# Patient Record
Sex: Male | Born: 1950 | Race: White | Hispanic: No | Smoking: Never smoker
Health system: Southern US, Community
[De-identification: ages and names within clinical notes are randomized; demographics above are authoritative.]

## PROBLEM LIST (undated history)

## (undated) DIAGNOSIS — I519 Heart disease, unspecified: Secondary | ICD-10-CM

## (undated) DIAGNOSIS — I10 Essential (primary) hypertension: Secondary | ICD-10-CM

## (undated) DIAGNOSIS — E785 Hyperlipidemia, unspecified: Secondary | ICD-10-CM

## (undated) DIAGNOSIS — I2510 Atherosclerotic heart disease of native coronary artery without angina pectoris: Secondary | ICD-10-CM

## (undated) DIAGNOSIS — I480 Paroxysmal atrial fibrillation: Secondary | ICD-10-CM

## (undated) HISTORY — DX: Paroxysmal atrial fibrillation: I48.0

## (undated) HISTORY — DX: Heart disease, unspecified: I51.9

## (undated) HISTORY — DX: Essential (primary) hypertension: I10

## (undated) HISTORY — DX: Hyperlipidemia, unspecified: E78.5

## (undated) HISTORY — PX: COLONOSCOPY: SHX174

## (undated) HISTORY — DX: Atherosclerotic heart disease of native coronary artery without angina pectoris: I25.10

---

## 2003-11-22 ENCOUNTER — Ambulatory Visit (HOSPITAL_COMMUNITY): Admission: RE | Admit: 2003-11-22 | Discharge: 2003-11-22 | Payer: Self-pay | Admitting: General Surgery

## 2003-12-01 HISTORY — PX: NM MYOVIEW LTD: HXRAD82

## 2004-01-17 ENCOUNTER — Ambulatory Visit (HOSPITAL_COMMUNITY): Admission: RE | Admit: 2004-01-17 | Discharge: 2004-01-17 | Payer: Self-pay | Admitting: *Deleted

## 2004-01-21 ENCOUNTER — Ambulatory Visit (HOSPITAL_COMMUNITY): Admission: RE | Admit: 2004-01-21 | Discharge: 2004-01-21 | Payer: Self-pay | Admitting: *Deleted

## 2004-01-21 HISTORY — PX: CARDIAC CATHETERIZATION: SHX172

## 2004-03-09 ENCOUNTER — Emergency Department (HOSPITAL_COMMUNITY): Admission: EM | Admit: 2004-03-09 | Discharge: 2004-03-09 | Payer: Self-pay | Admitting: Emergency Medicine

## 2004-07-12 HISTORY — PX: DOPPLER ECHOCARDIOGRAPHY: SHX263

## 2004-09-20 ENCOUNTER — Inpatient Hospital Stay (HOSPITAL_COMMUNITY): Admission: EM | Admit: 2004-09-20 | Discharge: 2004-09-23 | Payer: Self-pay | Admitting: *Deleted

## 2005-11-01 ENCOUNTER — Ambulatory Visit (HOSPITAL_COMMUNITY): Admission: RE | Admit: 2005-11-01 | Discharge: 2005-11-01 | Payer: Self-pay | Admitting: *Deleted

## 2005-11-09 ENCOUNTER — Ambulatory Visit (HOSPITAL_COMMUNITY): Admission: RE | Admit: 2005-11-09 | Discharge: 2005-11-09 | Payer: Self-pay | Admitting: *Deleted

## 2005-11-09 HISTORY — PX: CARDIOVERSION: SHX1299

## 2010-02-02 ENCOUNTER — Ambulatory Visit (HOSPITAL_COMMUNITY): Admission: RE | Admit: 2010-02-02 | Discharge: 2010-02-02 | Payer: Self-pay | Admitting: Internal Medicine

## 2010-02-08 ENCOUNTER — Ambulatory Visit (HOSPITAL_COMMUNITY)
Admission: RE | Admit: 2010-02-08 | Discharge: 2010-02-08 | Payer: Self-pay | Source: Home / Self Care | Admitting: Urology

## 2010-07-21 NOTE — Cardiovascular Report (Signed)
[name], Dennis Snyder              ACCOUNT NO.:  [id number]   MEDICAL RECORD NO.:  [id number]          PATIENT TYPE:  OIB   LOCATION:  2899                         FACILITY:  [facility]   PHYSICIAN:  Darlin Priestly, MD  DATE OF BIRTH:  29-Aug-1950   DATE OF PROCEDURE:  01/21/2004  DATE OF DISCHARGE:                              CARDIAC CATHETERIZATION   PROCEDURES:  1.  Left heart catheterization.  2.  Coronary angiography.  3.  Left ventriculogram.  4.  Abdominal aortogram.   ATTENDING PHYSICIAN:  Darlin Priestly, M.D.   COMPLICATIONS:  None.   INDICATIONS:  [name] is a 60 year old patient of Dr. Artis Delay with a  history of rheumatic fever at age 68.  He does have a history of mild  hyperlipidemia with recent screening Cardiolite on November 23, 2003  revealing evidence of inferior lateral wall scar toward the apex with mild  periinfarct ischemia.  He is now referred for cardiac catheterization to  rule out significant coronary artery disease.   DESCRIPTION OF OPERATION:  After obtaining informed written consent, the  patient was brought to the cardiac catheterization laboratory.  Right and  left groin were shaved and prepped and draped in the usual sterile fashion.  ECG monitor was established.  Using the modified Seldinger technique, a 6  French arterial sheath was inserted in the right femoral artery. A 6 French  diagnostic catheter was then used to performed diagnostic angiography.   The left main is a large vessel with no significant disease.   The LAD is a large vessel that coursed to the apex and gives rise to two  diagonal branches.  The LAD is noted to have some mild 40% mid vessel  bridging.   The first and second diagonals are medium size vessels with no significant  disease.   The left coronary artery also gives rise to a medium size ramus intermedius  which bifurcates distally with no significant disease.   Left circumflex is a medium size vessel  which courses in the AV groove and  gives rise to two  obtuse marginal branches.  There is no significant  disease in the AV groove circumflex.   The first and second OMs are medium size vessels with no significant  disease.   The right coronary artery is a medium size vessel which is dominant and  gives rise to both PDA as well as posterior lateral branch.  The RCA is  noted to have some mild 30% ostial and proximal narrowing, but no further  significant disease in the RCA, PDA or posterior lateral branch.   LEFT VENTRICULOGRAM:  Reveals moderately depressed EF of approximately 40%  with inferior and apical akinesis.   ABDOMINAL AORTOGRAM:  Reveals no evidence of significant renal artery  stenosis.   HEMODYNAMICS:  Systemic arterial pressure 175/91, LV pressure 175/14, LVEDP  of 20.   CONCLUSIONS:  1.  No significant coronary artery disease.  2.  Moderately depressed left ventricular systolic function with wall motion      abnormalities as noted above.  3.  No evidence of renal artery  stenosis.  4.  Systemic hypertension.  5.  Elevated left ventricular end-diastolic pressure.      Robe   RHM/MEDQ  D:  01/21/2004  T:  01/21/2004  Job:  [id number]   cc:   Madelin Rear. Sherwood Gambler, MD  [address]  [place]  [place] [address]  Fax: [phone]

## 2010-07-21 NOTE — Op Note (Signed)
Dennis Snyder, Dennis Snyder              ACCOUNT NO.:  [id number]   MEDICAL RECORD NO.:  [id number]          PATIENT TYPE:  OIB   LOCATION:  2899                         FACILITY:  [facility]   PHYSICIAN:  Cristy Hilts. Jacinto Halim, MD       DATE OF BIRTH:  1950-07-21   DATE OF PROCEDURE:  11/09/2005  DATE OF DISCHARGE:  11/09/2005                                 OPERATIVE REPORT   CARDIOLOGIST:  Vonna Kotyk R. Jacinto Halim, MD.   PROCEDURE PERFORMED:  Direct-current cardioversion.   INDICATIONS:  Atrial fibrillation.   FINDINGS:  Unsuccessful attempt at cardioversion in spite of delivering  synchronized biphasic 100 J, and then 120 J x2.  The patient did convert to  sinus rhythm only transiently, but relapsed back into atrial fibrillation.   RECOMMENDATIONS:  Based on the fact that he is able to convert, but he is  unable to hold his sinus rhythm, would recommend we start him on amiodarone  at about 200 mg b.i.d. for 10 days, then 200 mg once a day, and we can  reattempt cardioversion in about 2 to 3 weeks.  This can be done on an  outpatient basis.  The patient will follow up with Dr. Lenise Herald.   TECHNIQUE:  Using the help of an anesthesiologist, and using deep sedation,  initially 100 J synchronized direct-current cardioversion was performed with  a biphasic defibrillator, initially using 100 J, and then 120 J x2.  The  procedure was abandoned because of inability to maintain sinus rhythm.      Cristy Hilts. Jacinto Halim, MD  Electronically Signed     JRG/MEDQ  D:  11/09/2005  T:  11/09/2005  Job:  [id number]   cc:   Darlin Priestly, MD  Kirk Ruths, M.D.

## 2010-07-21 NOTE — Discharge Summary (Signed)
[name], Dennis Snyder              ACCOUNT NO.:  [id number]   MEDICAL RECORD NO.:  [id number]          PATIENT TYPE:  INP   LOCATION:  3705                         FACILITY:  [facility]   PHYSICIAN:  Darlin Priestly, MD  DATE OF BIRTH:  1951-02-23   DATE OF ADMISSION:  09/20/2004  DATE OF DISCHARGE:  09/23/2004                                 DISCHARGE SUMMARY   DISCHARGE DIAGNOSES:  1.  Paroxysm atrial fibrillation, sinus rhythm at discharge.  2.  History of noncritical coronary disease.  3.  Left ventricular dysfunction with an ejection fraction of 40%.  4.  History of hypertension.  5.  History of hyperlipidemia.   HOSPITAL COURSE:  The patient is a 60 year old male followed by our group  who was found to be in atrial flutter on admission. He has had previous  Cardiolite study in September 2005 was low risk. Echocardiogram showed some  mitral valve thickening and mild TR and mild to moderate AR with good LV  function. He was admitted by Dr. Jenne Campus for new onset atrial fibrillation.  He was started on IV heparin and put on Coumadin. He converted spontaneously  to sinus rhythm, sinus bradycardia. Ultimately, we felt he could be  discharged September 23, 2004.   DISCHARGE MEDICATIONS:  1.  Coumadin 5 milligrams a day or as directed.  2.  Zocor 20 milligrams a day.  3.  Cardizem 180 milligrams a day.  4.  Baby aspirin q.d.   LABORATORY DATA:  White count 8.6, hemoglobin 13.1, hematocrit 37.9,  platelets 186,000. INR at discharge 3.3. Sodium 141, potassium 3.6, BUN 20,  creatinine 1.0. Liver functions were normal. CK is negative x1. Hemoglobin  A1c is 5.9. LDL 55, HDL 31, TSH 2.03.   DISPOSITION:  The patient is discharged in stable condition. He will have a  pro time Tuesday after discharge. He will follow up Dr. Jenne Campus in the  office.      Abelino Derrick, P.A.      Darlin Priestly, MD  Electronically Signed    LKK/MEDQ  D:  11/13/2004  T:  11/13/2004  Job:  [id number]   cc:   Darlin Priestly, MD  [phone] [address], [address]  [place]  [place] [address]  Fax: [phone]

## 2011-04-24 ENCOUNTER — Ambulatory Visit (HOSPITAL_COMMUNITY)
Admission: RE | Admit: 2011-04-24 | Discharge: 2011-04-24 | Disposition: A | Discharge status: Home / Self Care | Payer: Managed Care, Other (non HMO) | Source: Ambulatory Visit | Attending: Cardiovascular Disease | Admitting: Cardiovascular Disease

## 2011-04-24 DIAGNOSIS — I2510 Atherosclerotic heart disease of native coronary artery without angina pectoris: Secondary | ICD-10-CM | POA: Insufficient documentation

## 2011-04-24 DIAGNOSIS — I4891 Unspecified atrial fibrillation: Secondary | ICD-10-CM | POA: Insufficient documentation

## 2011-04-24 NOTE — Progress Notes (Signed)
*  PRELIMINARY RESULTS* Echocardiogram 2D Echocardiogram has been performed.  Conrad Burke 04/24/2011, 8:26 AM

## 2011-11-12 ENCOUNTER — Telehealth: Payer: Self-pay | Admitting: *Deleted

## 2011-11-12 NOTE — Telephone Encounter (Signed)
Dennis Snyder called today to speak with you about his colonoscopy appt. Please call him back. Thanks.

## 2011-11-12 NOTE — Telephone Encounter (Signed)
LMOM for a return call.  

## 2011-11-13 NOTE — Telephone Encounter (Signed)
LMOM at home for pt to call.  

## 2011-11-14 NOTE — Telephone Encounter (Signed)
Pt is scheduled for 11/20/2011 @ 8:00 Am with Tana Coast, PA ( on coumadin and needs to schedule screening colonoscopy.)

## 2011-11-20 ENCOUNTER — Encounter: Payer: Self-pay | Admitting: Gastroenterology

## 2011-11-20 ENCOUNTER — Telehealth: Payer: Self-pay

## 2011-11-20 ENCOUNTER — Ambulatory Visit (INDEPENDENT_AMBULATORY_CARE_PROVIDER_SITE_OTHER): Payer: Managed Care, Other (non HMO) | Admitting: Gastroenterology

## 2011-11-20 VITALS — BP 124/66 | HR 45 | Temp 97.4°F | Ht 71.0 in | Wt 200.0 lb

## 2011-11-20 DIAGNOSIS — Z1211 Encounter for screening for malignant neoplasm of colon: Secondary | ICD-10-CM

## 2011-11-20 DIAGNOSIS — Z79899 Other long term (current) drug therapy: Secondary | ICD-10-CM | POA: Insufficient documentation

## 2011-11-20 DIAGNOSIS — R197 Diarrhea, unspecified: Secondary | ICD-10-CM

## 2011-11-20 MED ORDER — PEG-KCL-NACL-NASULF-NA ASC-C 100 G PO SOLR: 1.0000 | ORAL | Status: DC

## 2011-11-20 NOTE — Telephone Encounter (Signed)
Called Harriett Sine at [facility]. She said that Dr. Allyson Sabal will be in the office on Friday this week. ( He is hospital based for the next couple of days). I faxed request to her to have Dr. Allyson Sabal address holding the pt's Pradaxa prior to colonoscopy. ( pt is scheduled for 12/17/2011). Confirmed with Nya that the fax was received and they will return when form is completed.

## 2011-11-20 NOTE — Assessment & Plan Note (Signed)
Patient presents to schedule colonoscopy. He is on Pradaxa for A. Fib. He had recent episode of diarrhea which lasted for several days but has since resolved. He tells me that every few months he will have a couple of days of loose stools but it takes care of itself. Otherwise, no alarm symptoms. Last colonoscopy over 10 years ago, he denies polyps.  I have discussed the risks, alternatives, benefits with regards to but not limited to the risk of reaction to medication, bleeding, infection, perforation and the patient is agreeable to proceed. Written consent to be obtained.

## 2011-11-20 NOTE — Progress Notes (Signed)
Primary Care Physician:  FUSCO,LAWRENCE J., MD  Primary Gastroenterologist:  Michael Rourk, MD   Chief Complaint  Patient presents with  . Colonoscopy    HPI:  Dennis Snyder is a 61 y.o. male here to schedule colonoscopy. Three to four weeks ago had severe diarrhea. Out of work for three days. Finally checked up. While having that, he had rotten belches. Rare heartburn. No dysphagia. No abdominal pain. Some excessive gas. Every few months has a couple days of diarrhea. No melena, brbpr. No weight loss. Last TCS over 10 years ago, no polyps per patient.   Current Outpatient Prescriptions  Medication Sig Dispense Refill  . amiodarone (PACERONE) 200 MG tablet Take 200 mg by mouth daily.      . Ascorbic Acid (VITAMIN C) 1000 MG tablet Take 1,000 mg by mouth daily.      . aspirin 81 MG tablet Take 81 mg by mouth daily.      . Coenzyme Q10 (CO Q-10) 100 MG CAPS Take 100 mg by mouth daily.      . fenofibrate 160 MG tablet Take 160 mg by mouth daily.       . fish oil-omega-3 fatty acids 1000 MG capsule Take 2 g by mouth daily.      . Glucosamine 500 MG CAPS Take 1,500 mg by mouth daily. With Chrondrotin 1200 mg      . lisinopril (PRINIVIL,ZESTRIL) 5 MG tablet Take 5 mg by mouth daily.       . Multiple Vitamin (MULTIVITAMIN) capsule Take 1 capsule by mouth daily.      . PRADAXA 150 MG CAPS 150 mg every 12 (twelve) hours.       . simvastatin (ZOCOR) 20 MG tablet Take 20 mg by mouth daily.       . Tamsulosin HCl (FLOMAX) 0.4 MG CAPS Take 0.4 mg by mouth daily.       . verapamil (VERELAN PM) 120 MG 24 hr capsule Take 120 mg by mouth at bedtime.       . peg 3350 powder (MOVIPREP) 100 G SOLR Take 1 kit (100 g total) by mouth as directed.  1 kit  0    Allergies as of 11/20/2011  . (Not on File)    Past Medical History  Diagnosis Date  . Hyperlipidemia   . Hypertension   . Ventricular dysfunction     Left/ ejection fraction of 40%.  . Coronary disease     History of noncritical   .  Paroxysmal atrial fibrillation     Past Surgical History  Procedure Date  . Colonoscopy     2002, no polyps per patient.     Family History  Problem Relation Age of Onset  . Colon cancer Neg Hx   . Liver disease Neg Hx     History   Social History  . Marital Status: Divorced    Spouse Name: N/A    Number of Children: N/A  . Years of Education: N/A   Occupational History  . [employer]    Social History Main Topics  . Smoking status: Never Smoker   . Smokeless tobacco: Not on file  . Alcohol Use: Yes     socially  . Drug Use: No  . Sexually Active: Not on file   Other Topics Concern  . Not on file   Social History Narrative  . No narrative on file      ROS:  General: Negative for anorexia, weight loss, fever, chills, fatigue,   weakness. Eyes: Negative for vision changes.  ENT: Negative for hoarseness, difficulty swallowing , nasal congestion. CV: Negative for chest pain, angina, palpitations, dyspnea on exertion, peripheral edema.  Respiratory: Negative for dyspnea at rest, dyspnea on exertion, cough, sputum, wheezing.  GI: See history of present illness. GU:  Negative for dysuria, hematuria, urinary incontinence, urinary frequency, nocturnal urination.  MS: Negative for joint pain, low back pain.  Derm: Negative for rash or itching.  Neuro: Negative for weakness, abnormal sensation, seizure, frequent headaches, memory loss, confusion.  Psych: Negative for anxiety, depression, suicidal ideation, hallucinations.  Endo: Negative for unusual weight change.  Heme: Negative for bruising or bleeding. Allergy: Negative for rash or hives.    Physical Examination:  BP 124/66  Pulse 45  Temp 97.4 F (36.3 C) (Temporal)  Ht 5' 11" (1.803 m)  Wt 200 lb (90.719 kg)  BMI 27.89 kg/m2   General: Well-nourished, well-developed in no acute distress.  Head: Normocephalic, atraumatic.   Eyes: Conjunctiva pink, no icterus. Mouth: Oropharyngeal mucosa moist and  pink , no lesions erythema or exudate. Neck: Supple without thyromegaly, masses, or lymphadenopathy.  Lungs: Clear to auscultation bilaterally.  Heart: Regular rate and rhythm, no murmurs rubs or gallops.  Abdomen: Bowel sounds are normal, nontender, nondistended, no hepatosplenomegaly or masses, no abdominal bruits or    hernia , no rebound or guarding.   Rectal: deferred Extremities: No lower extremity edema. No clubbing or deformities.  Neuro: Alert and oriented x 4 , grossly normal neurologically.  Skin: Warm and dry, no rash or jaundice.   Psych: Alert and cooperative, normal mood and affect.   

## 2011-11-20 NOTE — Patient Instructions (Addendum)
We have scheduled you for a colonoscopy with Dr. Jena Gauss. We will call you with further instructions regarding holding your Pradaxa prior to the procedure.

## 2011-11-23 ENCOUNTER — Other Ambulatory Visit: Payer: Self-pay | Admitting: Internal Medicine

## 2011-11-23 DIAGNOSIS — Z1211 Encounter for screening for malignant neoplasm of colon: Secondary | ICD-10-CM

## 2011-11-23 MED ORDER — PEG 3350-KCL-NA BICARB-NACL 420 G PO SOLR: 4000.0000 mL | ORAL | Status: DC

## 2011-11-26 NOTE — Telephone Encounter (Signed)
Received the fax from Dr. Allyson Sabal. Hold Pradaxa 2 days before procedure and two days after. Called and informed the pt.

## 2011-11-26 NOTE — Progress Notes (Signed)
Pt said he picked up two preps, movie prep and trilyte prep. I called pharmacy and spoke to Diane, and she said he did pick up two. ( Usually, we send a second prep if pharmacy or pt calls and says first too expensive or not covered). They gave him both. He said that he has not opened even the bag and would like to return. Per Diane, if he has not opened at all, he can get the refund. He said he is going to return the movie prep. I told him I am mailing his instructions again for the trilyte prep to assure he has correct instuctions. Placing in the mail today.

## 2011-12-03 NOTE — Progress Notes (Signed)
Patient given instructions to hold Pradaxa for two days before procedure. Per Dr. Allyson Sabal recommend holding for two days after procedure as well.

## 2011-12-03 NOTE — Telephone Encounter (Signed)
Noted. Will also document under original OV note.

## 2011-12-17 ENCOUNTER — Encounter (HOSPITAL_COMMUNITY): Payer: Self-pay | Admitting: *Deleted

## 2011-12-17 ENCOUNTER — Encounter (HOSPITAL_COMMUNITY)
Admission: RE | Disposition: A | Discharge status: Home / Self Care | Payer: Self-pay | Source: Ambulatory Visit | Attending: Internal Medicine

## 2011-12-17 ENCOUNTER — Ambulatory Visit (HOSPITAL_COMMUNITY)
Admission: RE | Admit: 2011-12-17 | Discharge: 2011-12-17 | Disposition: A | Discharge status: Home / Self Care | Payer: Managed Care, Other (non HMO) | Source: Ambulatory Visit | Attending: Internal Medicine | Admitting: Internal Medicine

## 2011-12-17 DIAGNOSIS — E785 Hyperlipidemia, unspecified: Secondary | ICD-10-CM | POA: Insufficient documentation

## 2011-12-17 DIAGNOSIS — Z1211 Encounter for screening for malignant neoplasm of colon: Secondary | ICD-10-CM

## 2011-12-17 DIAGNOSIS — I10 Essential (primary) hypertension: Secondary | ICD-10-CM | POA: Insufficient documentation

## 2011-12-17 HISTORY — PX: COLONOSCOPY: SHX5424

## 2011-12-17 HISTORY — DX: Atherosclerotic heart disease of native coronary artery without angina pectoris: I25.10

## 2011-12-17 SURGERY — COLONOSCOPY
Anesthesia: Moderate Sedation

## 2011-12-17 MED ORDER — MEPERIDINE HCL 100 MG/ML IJ SOLN
INTRAMUSCULAR | Status: DC | PRN
  Administered 2011-12-17: 50 mg via INTRAVENOUS

## 2011-12-17 MED ORDER — STERILE WATER FOR IRRIGATION IR SOLN
Status: DC | PRN
  Administered 2011-12-17: 10:00:00

## 2011-12-17 MED ORDER — MIDAZOLAM HCL 5 MG/5ML IJ SOLN
INTRAMUSCULAR | Status: AC
  Filled 2011-12-17: qty 10

## 2011-12-17 MED ORDER — MINERAL OIL PO OIL
TOPICAL_OIL | ORAL | Status: AC
  Filled 2011-12-17: qty 60

## 2011-12-17 MED ORDER — MEPERIDINE HCL 100 MG/ML IJ SOLN
INTRAMUSCULAR | Status: AC
  Filled 2011-12-17: qty 1

## 2011-12-17 MED ORDER — MIDAZOLAM HCL 5 MG/5ML IJ SOLN
INTRAMUSCULAR | Status: DC | PRN
  Administered 2011-12-17: 2 mg via INTRAVENOUS
  Administered 2011-12-17: 1 mg via INTRAVENOUS

## 2011-12-17 MED ORDER — SODIUM CHLORIDE 0.45 % IV SOLN
INTRAVENOUS | Status: DC
  Administered 2011-12-17: 10:00:00 via INTRAVENOUS

## 2011-12-17 NOTE — Interval H&P Note (Signed)
History and Physical Interval Note:  12/17/2011 9:59 AM  Dennis Snyder  has presented today for surgery, with the diagnosis of Screening Colonoscopy  The various methods of treatment have been discussed with the patient and family. After consideration of risks, benefits and other options for treatment, the patient has consented to  Procedure(s) (LRB) with comments: COLONOSCOPY (N/A) - 10:30 as a surgical intervention .  The patient's history has been reviewed, patient examined, no change in status, stable for surgery.  I have reviewed the patient's chart and labs.  Questions were answered to the patient's satisfaction.     Eula Listen

## 2011-12-17 NOTE — H&P (View-Only) (Signed)
Primary Care Physician:  Cassell Smiles., MD  Primary Gastroenterologist:  Roetta Sessions, MD   Chief Complaint  Patient presents with  . Colonoscopy    HPI:  Dennis Snyder is a 61 y.o. male here to schedule colonoscopy. Three to four weeks ago had severe diarrhea. Out of work for three days. Finally checked up. While having that, he had rotten belches. Rare heartburn. No dysphagia. No abdominal pain. Some excessive gas. Every few months has a couple days of diarrhea. No melena, brbpr. No weight loss. Last TCS over 10 years ago, no polyps per patient.   Current Outpatient Prescriptions  Medication Sig Dispense Refill  . amiodarone (PACERONE) 200 MG tablet Take 200 mg by mouth daily.      . Ascorbic Acid (VITAMIN C) 1000 MG tablet Take 1,000 mg by mouth daily.      Marland Kitchen aspirin 81 MG tablet Take 81 mg by mouth daily.      . Coenzyme Q10 (CO Q-10) 100 MG CAPS Take 100 mg by mouth daily.      . fenofibrate 160 MG tablet Take 160 mg by mouth daily.       . fish oil-omega-3 fatty acids 1000 MG capsule Take 2 g by mouth daily.      . Glucosamine 500 MG CAPS Take 1,500 mg by mouth daily. With Chrondrotin 1200 mg      . lisinopril (PRINIVIL,ZESTRIL) 5 MG tablet Take 5 mg by mouth daily.       . Multiple Vitamin (MULTIVITAMIN) capsule Take 1 capsule by mouth daily.      Marland Kitchen PRADAXA 150 MG CAPS 150 mg every 12 (twelve) hours.       . simvastatin (ZOCOR) 20 MG tablet Take 20 mg by mouth daily.       . Tamsulosin HCl (FLOMAX) 0.4 MG CAPS Take 0.4 mg by mouth daily.       . verapamil (VERELAN PM) 120 MG 24 hr capsule Take 120 mg by mouth at bedtime.       . peg 3350 powder (MOVIPREP) 100 G SOLR Take 1 kit (100 g total) by mouth as directed.  1 kit  0    Allergies as of 11/20/2011  . (Not on File)    Past Medical History  Diagnosis Date  . Hyperlipidemia   . Hypertension   . Ventricular dysfunction     Left/ ejection fraction of 40%.  . Coronary disease     History of noncritical   .  Paroxysmal atrial fibrillation     Past Surgical History  Procedure Date  . Colonoscopy     2002, no polyps per patient.     Family History  Problem Relation Age of Onset  . Colon cancer Neg Hx   . Liver disease Neg Hx     History   Social History  . Marital Status: Divorced    Spouse Name: N/A    Number of Children: N/A  . Years of Education: N/A   Occupational History  . [employer]    Social History Main Topics  . Smoking status: Never Smoker   . Smokeless tobacco: Not on file  . Alcohol Use: Yes     socially  . Drug Use: No  . Sexually Active: Not on file   Other Topics Concern  . Not on file   Social History Narrative  . No narrative on file      ROS:  General: Negative for anorexia, weight loss, fever, chills, fatigue,  weakness. Eyes: Negative for vision changes.  ENT: Negative for hoarseness, difficulty swallowing , nasal congestion. CV: Negative for chest pain, angina, palpitations, dyspnea on exertion, peripheral edema.  Respiratory: Negative for dyspnea at rest, dyspnea on exertion, cough, sputum, wheezing.  GI: See history of present illness. GU:  Negative for dysuria, hematuria, urinary incontinence, urinary frequency, nocturnal urination.  MS: Negative for joint pain, low back pain.  Derm: Negative for rash or itching.  Neuro: Negative for weakness, abnormal sensation, seizure, frequent headaches, memory loss, confusion.  Psych: Negative for anxiety, depression, suicidal ideation, hallucinations.  Endo: Negative for unusual weight change.  Heme: Negative for bruising or bleeding. Allergy: Negative for rash or hives.    Physical Examination:  BP 124/66  Pulse 45  Temp 97.4 F (36.3 C) (Temporal)  Ht 5\' 11"  (1.803 m)  Wt 200 lb (90.719 kg)  BMI 27.89 kg/m2   General: Well-nourished, well-developed in no acute distress.  Head: Normocephalic, atraumatic.   Eyes: Conjunctiva pink, no icterus. Mouth: Oropharyngeal mucosa moist and  pink , no lesions erythema or exudate. Neck: Supple without thyromegaly, masses, or lymphadenopathy.  Lungs: Clear to auscultation bilaterally.  Heart: Regular rate and rhythm, no murmurs rubs or gallops.  Abdomen: Bowel sounds are normal, nontender, nondistended, no hepatosplenomegaly or masses, no abdominal bruits or    hernia , no rebound or guarding.   Rectal: deferred Extremities: No lower extremity edema. No clubbing or deformities.  Neuro: Alert and oriented x 4 , grossly normal neurologically.  Skin: Warm and dry, no rash or jaundice.   Psych: Alert and cooperative, normal mood and affect.

## 2011-12-17 NOTE — Op Note (Signed)
[facility] [address] [place], [address]   COLONOSCOPY PROCEDURE REPORT  PATIENT: Dennis Snyder, Dennis Snyder  MR#:         [id number] BIRTHDATE: 1950-10-23 , 61  yrs. old GENDER: Male ENDOSCOPIST: R.  Roetta Sessions, MD FACP FACG REFERRED BY:  Artis Delay, M.D.  Runell Gess, M.D. PROCEDURE DATE:  12/17/2011 PROCEDURE:     Screening colonoscopy  INDICATIONS: Average risk colorectal cancer screening  INFORMED CONSENT:  The risks, benefits, alternatives and imponderables including but not limited to bleeding, perforation as well as the possibility of a missed lesion have been reviewed.  The potential for biopsy, lesion removal, etc. have also been discussed.  Questions have been answered.  All parties agreeable. Please see the history and physical in the medical record for more information.  MEDICATIONS: Versed 3 mg IV and Demerol 50 mg IV in divided doses.  DESCRIPTION OF PROCEDURE:  After a digital rectal exam was performed, the Pentax Colonoscope [id number]  colonoscope was advanced from the anus through the rectum and colon to the area of the cecum, ileocecal valve and appendiceal orifice.  The cecum was deeply intubated.  These structures were well-seen and photographed for the record.  From the level of the cecum and ileocecal valve, the scope was slowly and cautiously withdrawn.  The mucosal surfaces were carefully surveyed utilizing scope tip deflection to facilitate fold flattening as needed.  The scope was pulled down into the rectum where a thorough examination including retroflexion was performed.    FINDINGS:  Relatively poor prep compromised examination. Normal rectum. Grossly normal colonic mucosa. poor prep, particularly on the right side, may have obscured a subtle lesion.  THERAPEUTIC / DIAGNOSTIC MANEUVERS PERFORMED:  none  COMPLICATIONS: none; it is notable patient had a stable sinus bradycardia before, during and after the  procedure.  CECAL WITHDRAWAL TIME:  8 minutes  IMPRESSION:  Normal rectum and colon-see above  RECOMMENDATIONS: Repeat screening colonoscopy in 10 years.   _______________________________ eSigned:  R. Roetta Sessions, MD FACP [facility] 12/17/2011 10:49 AM   CC:

## 2011-12-21 ENCOUNTER — Encounter (HOSPITAL_COMMUNITY): Payer: Self-pay | Admitting: Internal Medicine

## 2012-07-01 ENCOUNTER — Other Ambulatory Visit (HOSPITAL_COMMUNITY): Payer: Self-pay | Admitting: Cardiovascular Disease

## 2012-07-01 DIAGNOSIS — I5030 Unspecified diastolic (congestive) heart failure: Secondary | ICD-10-CM

## 2012-07-02 ENCOUNTER — Ambulatory Visit (HOSPITAL_COMMUNITY)
Admission: RE | Admit: 2012-07-02 | Discharge: 2012-07-02 | Disposition: A | Discharge status: Home / Self Care | Payer: Managed Care, Other (non HMO) | Source: Ambulatory Visit | Attending: Cardiovascular Disease | Admitting: Cardiovascular Disease

## 2012-07-02 DIAGNOSIS — I5030 Unspecified diastolic (congestive) heart failure: Secondary | ICD-10-CM

## 2012-07-02 DIAGNOSIS — I501 Left ventricular failure: Secondary | ICD-10-CM | POA: Insufficient documentation

## 2012-07-02 HISTORY — PX: TRANSTHORACIC ECHOCARDIOGRAM: SHX275

## 2012-07-02 NOTE — Progress Notes (Signed)
[facility]   2D echo completed 07/02/2012.   Veda Canning, RDCS

## 2012-07-14 IMAGING — CR DG CHEST 2V
2 series · 2 of 2 positions shown · non-contrast
Comparison: 11/01/2005.

CLINICAL DATA: Coughing.  Upper chest pain.  Follow-up pneumonia on
the right side.

CHEST - 2 VIEW

[view not recorded (1 of 2)]
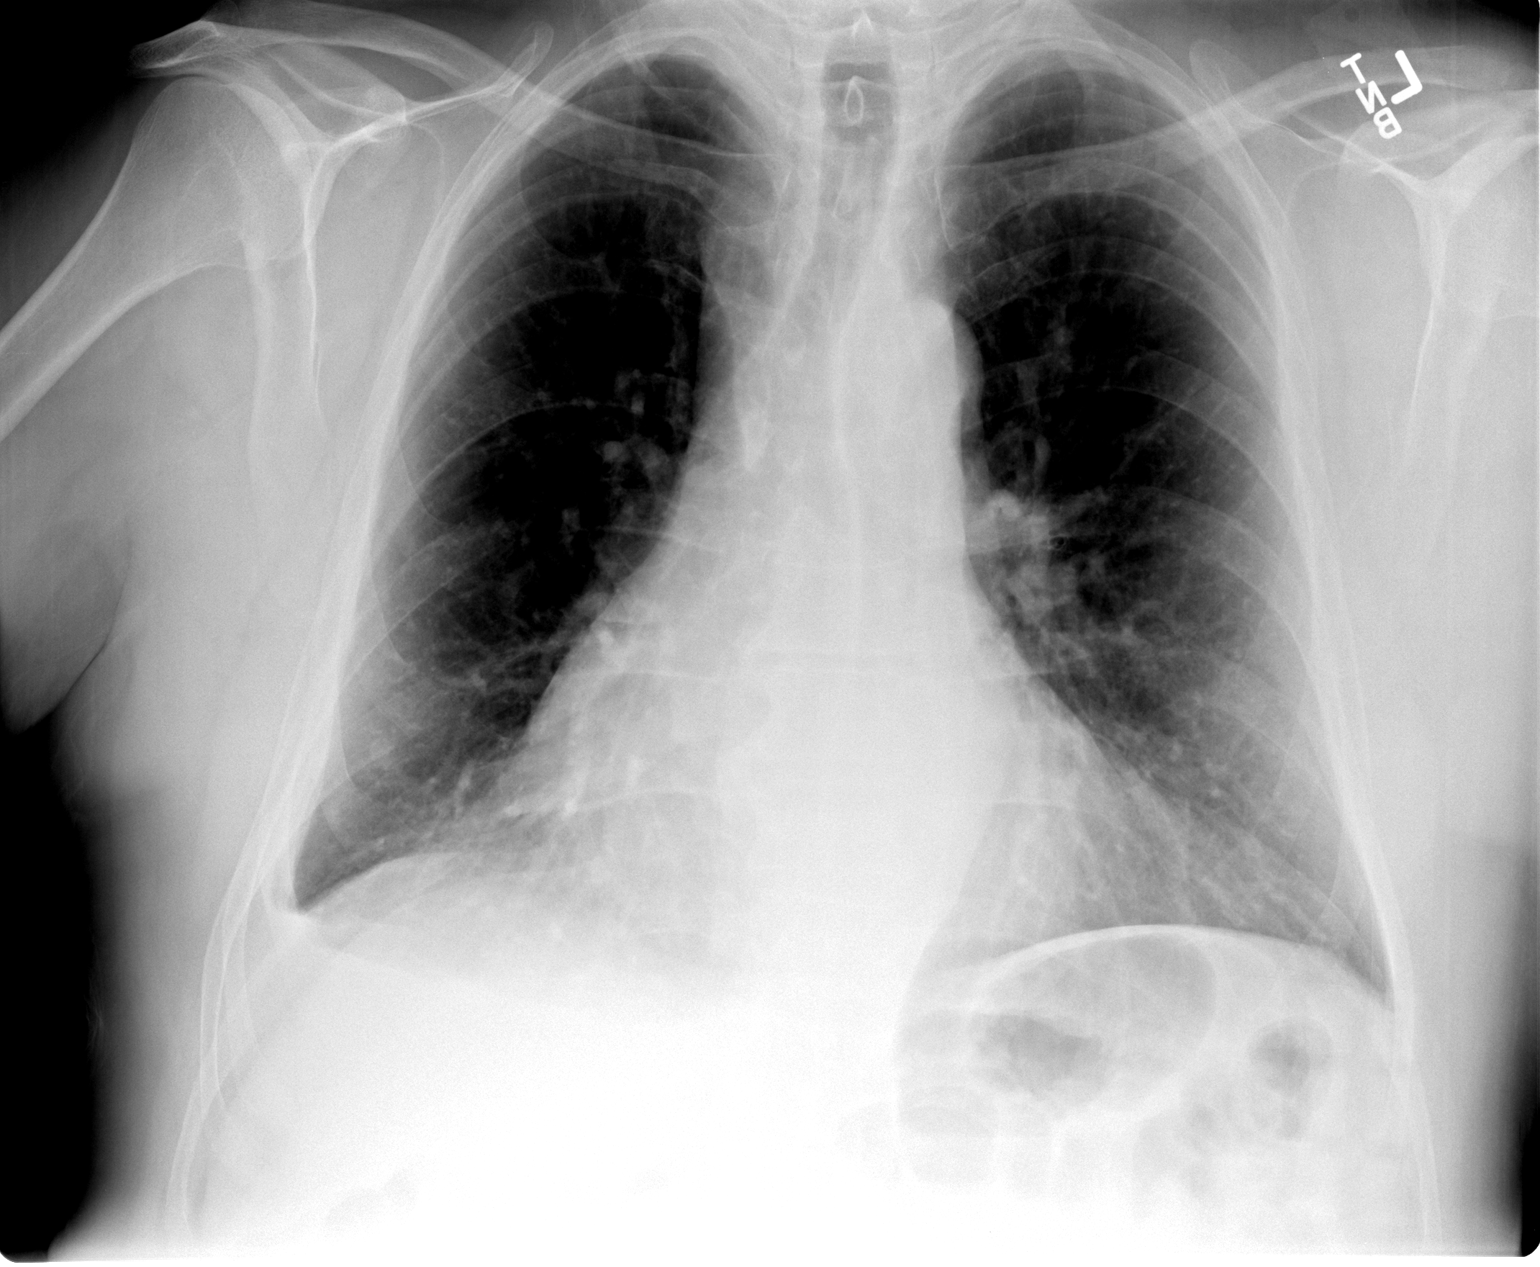

[view not recorded (2 of 2)]
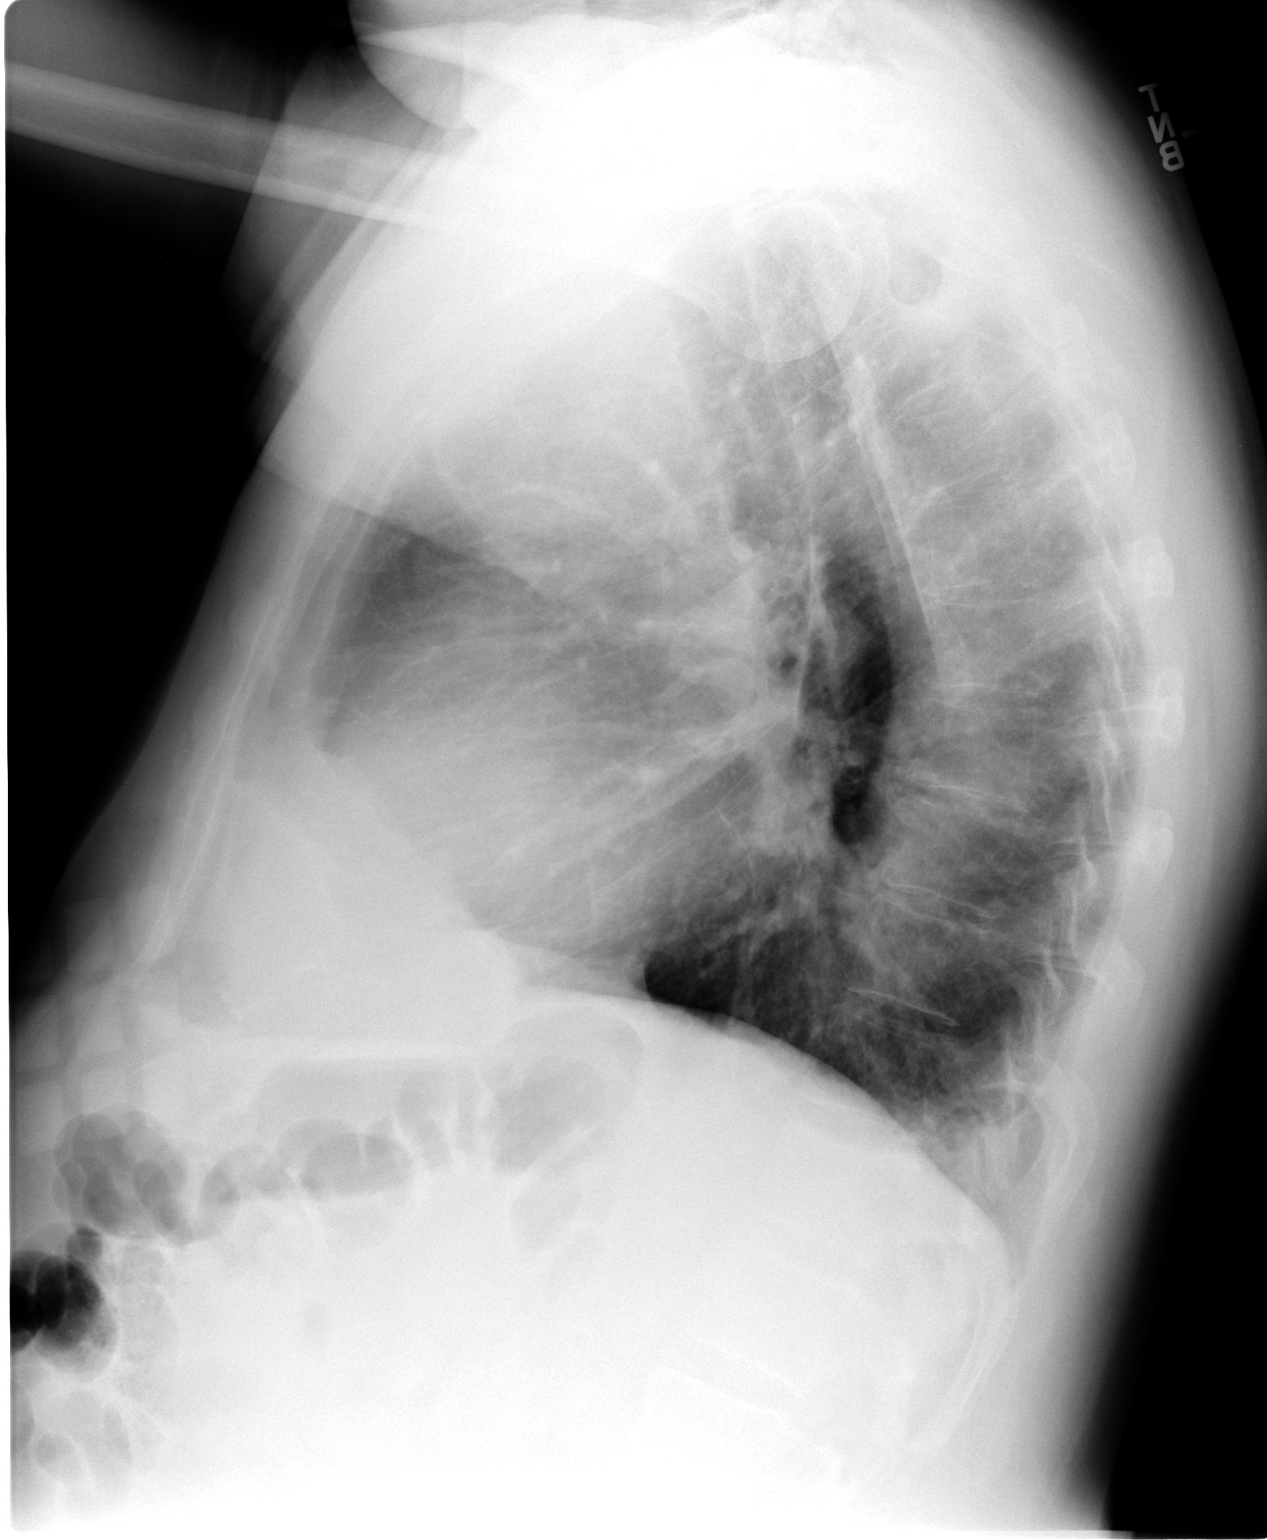

[2 of 2 positions shown; findings below may reference images not displayed]

FINDINGS: There is stable moderate enlargement of the cardiac
silhouette.  Ectasia and slight tortuosity of thoracic aorta is
identified.  There is noncalcific pleural thickening involving the
right costophrenic angle area and the lower lateral aspect of the
right hemithorax.  There is slight hyperinflation configuration.
No airspace disease is seen.  Bones appear average for age.  No
pneumothorax is evident.
IMPRESSION: Stable moderate enlargement of the cardiac silhouette.
There is noncalcific pleural thickening involving the right
costophrenic angle area and the lower lateral aspect of the right
hemithorax.  There is slight hyperinflation configuration.  No
airspace disease is seen.

## 2012-07-20 IMAGING — CT CT ABD-PEL WO/W CM
2 of 9 series · 11 of 46 positions shown, 18 images · IV contrast (Omnipaque 300)
Comparison: None.

CLINICAL DATA: Gross hematuria, left hydronephrosis

CT ABDOMEN AND PELVIS WITHOUT AND WITH CONTRAST
TECHNIQUE: Multidetector CT imaging of the abdomen and pelvis was
performed without contrast material in one or both body regions,
followed by contrast material(s) and further sections in one or
both body regions. Breast shield utilized.
Contrast: 125 ml Vmnipaque-9XX IV; oral contrast not administered
for this indication.

[Series 3: abd_pel_wo 5.0 b40f · axial · 0.76mm/px · z∈[-316,+70]mm · 9 of 97 slices shown, 15 images]
[im 10/97  soft-tissue]
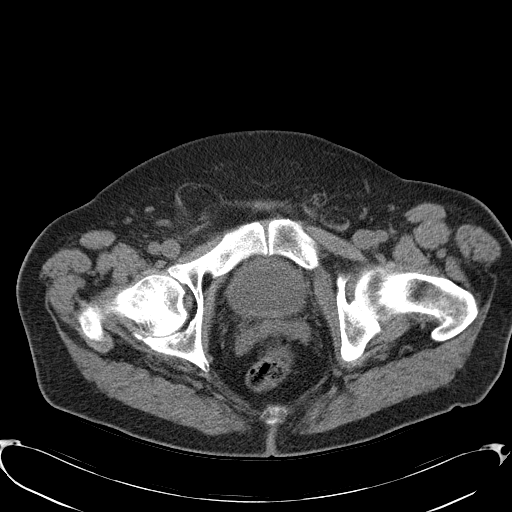
[im 10/97  bone]
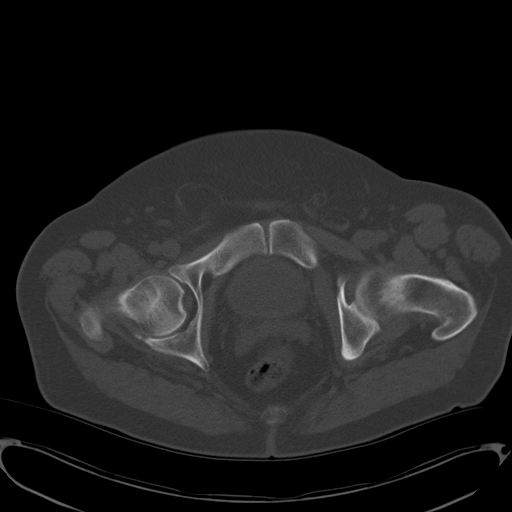
[im 20/97  soft-tissue]
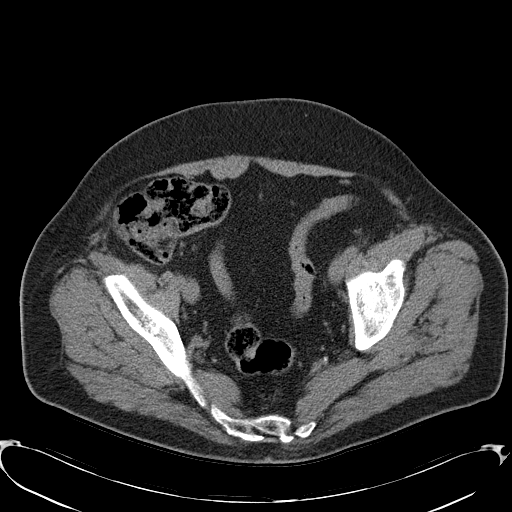
[im 29/97  soft-tissue]
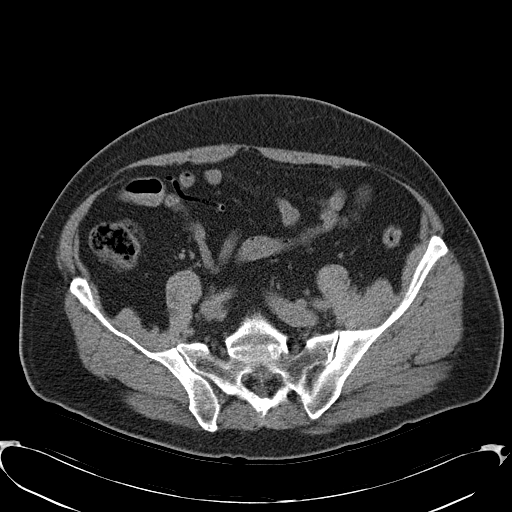
[im 39/97  soft-tissue]
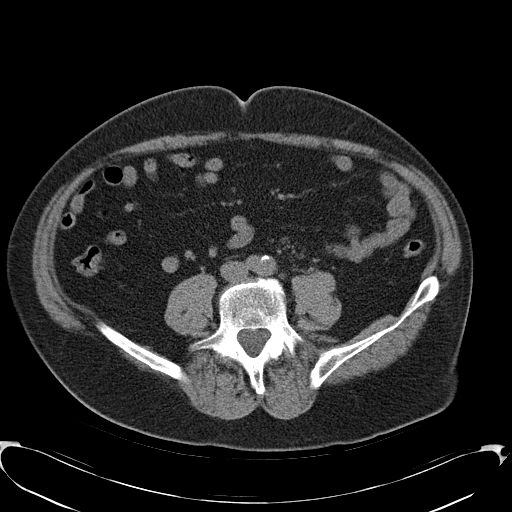
[im 49/97  soft-tissue]
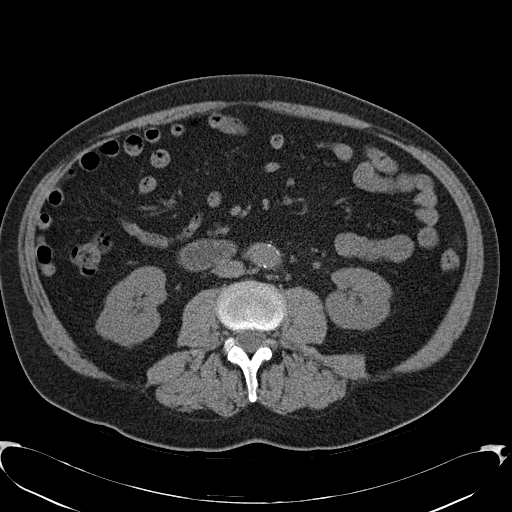
[im 58/97  soft-tissue]
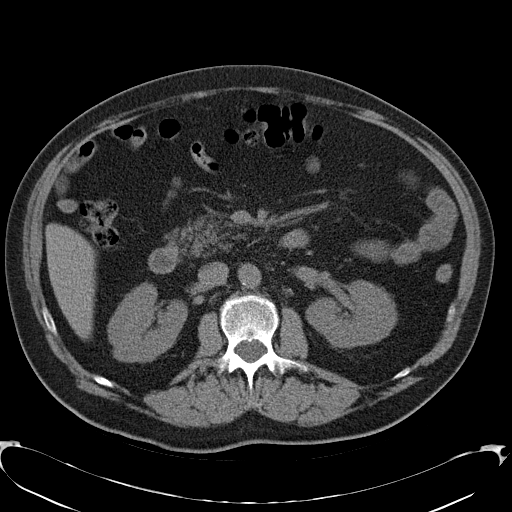
[im 58/97  lung]
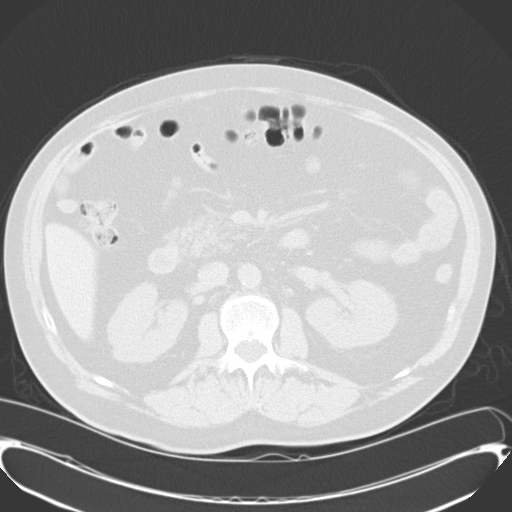
[im 68/97  soft-tissue]
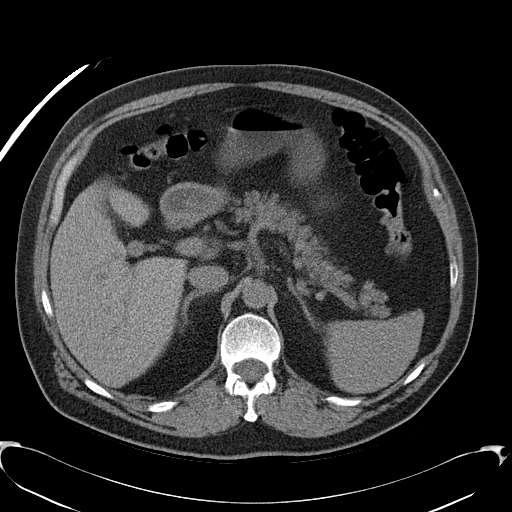
[im 68/97  lung]
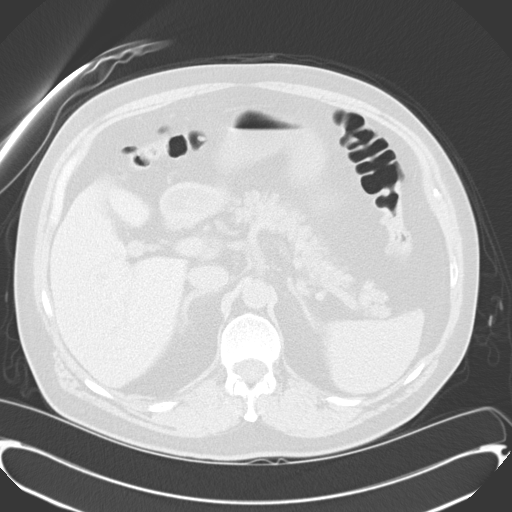
[im 77/97  soft-tissue]
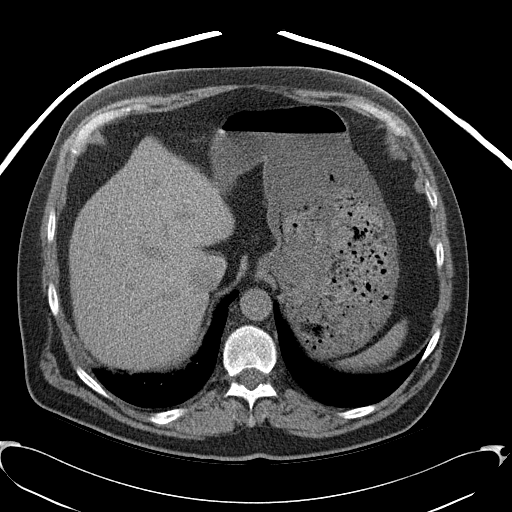
[im 77/97  lung]
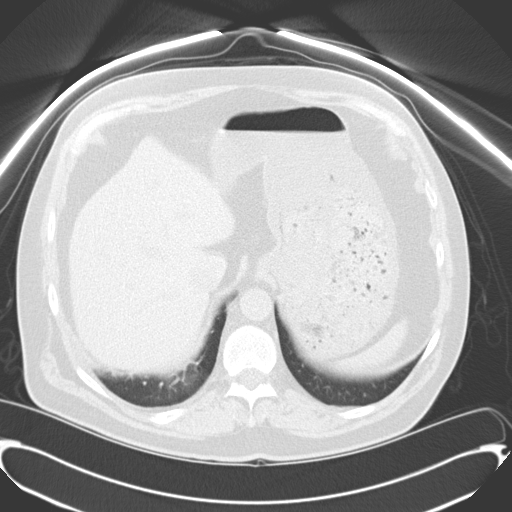
[im 87/97  soft-tissue]
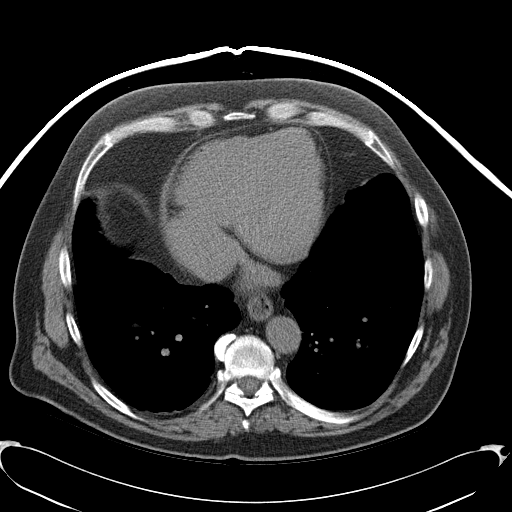
[im 87/97  lung]
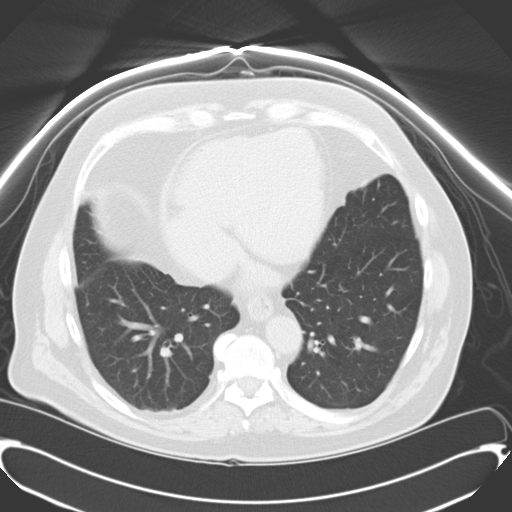
[im 87/97  bone]
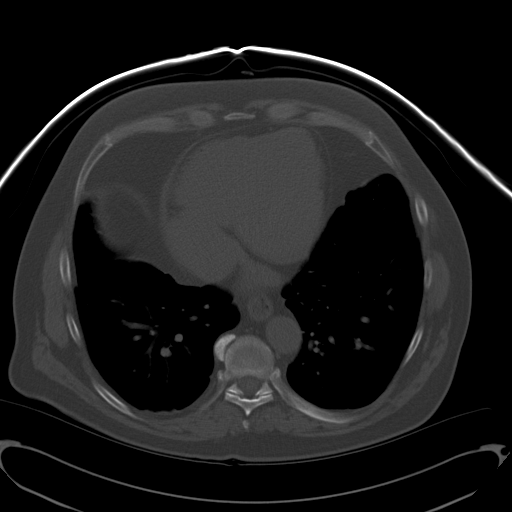

[Series 4: mpr pre contrast coronal 3.0 · coronal · non-contrast · 0.74mm/px · 2 of 99 slices shown, 3 images]
[im 33/99  soft-tissue]
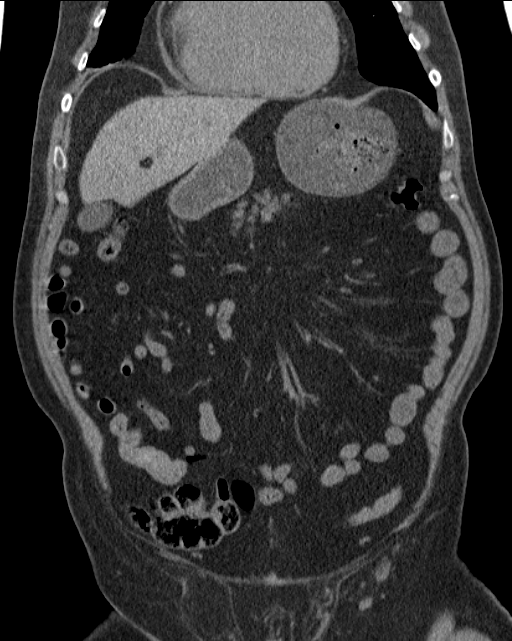
[im 33/99  bone]
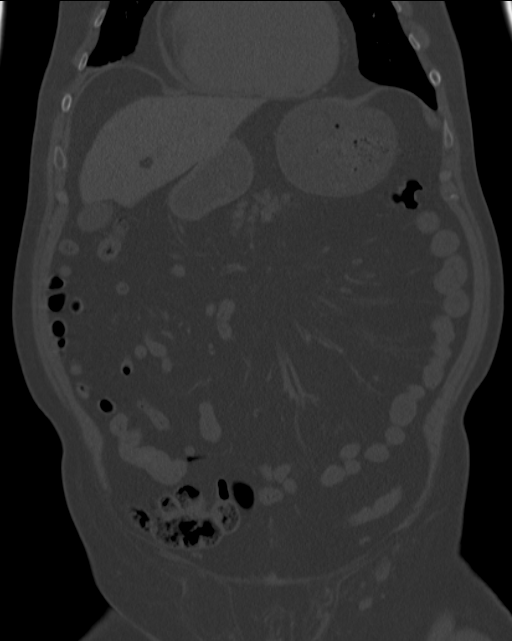
[im 66/99  soft-tissue]
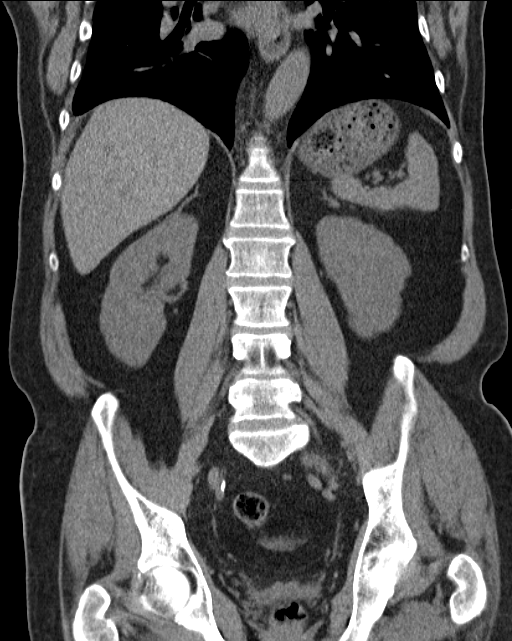

[11 of 46 positions shown; findings below may reference images not displayed]

FINDINGS: Minimal atelectasis right lung base.
No urinary tract calcification.
Single nonobstructed ureters.
Low attenuation foci in both kidneys, likely small cysts.
No definite solid renal mass or hydronephrosis.
Bladder only partially distended, otherwise unremarkable.
Enlarged right internal inguinal ring and right inguinal canal
compatible with inguinal hernia, containing fat.
Normal appendix.
Remainder of liver, spleen, pancreas, and adrenal glands normal
appearance.
Stomach and bowel loops unremarkable for technique.
No mass, adenopathy, free fluid or inflammatory process.
Few upper normal-sized inguinal lymph nodes are seen bilaterally.
Minimal enlargement of central lobar prostate gland.
No acute osseous findings.
IMPRESSION: No acute urinary tract abnormalities.
Probable tiny bilateral renal cysts.
Right inguinal hernia.

## 2013-03-15 ENCOUNTER — Other Ambulatory Visit: Payer: Self-pay | Admitting: Cardiovascular Disease

## 2013-03-16 ENCOUNTER — Other Ambulatory Visit: Payer: Self-pay | Admitting: *Deleted

## 2013-03-16 MED ORDER — LISINOPRIL 5 MG PO TABS: 5.0000 mg | ORAL_TABLET | Freq: Every day | ORAL | Status: DC

## 2013-03-16 NOTE — Telephone Encounter (Signed)
Rx was sent to pharmacy electronically. 

## 2013-03-17 NOTE — Telephone Encounter (Signed)
Rx(s) sent to pharmacy electronically.  

## 2013-03-18 ENCOUNTER — Other Ambulatory Visit: Payer: Self-pay | Admitting: *Deleted

## 2013-03-18 MED ORDER — LISINOPRIL 5 MG PO TABS: 5.0000 mg | ORAL_TABLET | Freq: Every day | ORAL | Status: DC

## 2013-03-30 ENCOUNTER — Telehealth: Payer: Self-pay | Admitting: Cardiovascular Disease

## 2013-03-30 MED ORDER — DABIGATRAN ETEXILATE MESYLATE 150 MG PO CAPS: 150.0000 mg | ORAL_CAPSULE | Freq: Two times a day (BID) | ORAL | Status: DC

## 2013-03-30 NOTE — Telephone Encounter (Signed)
Just received a letter from his mail order pharmacy saying they was still waiting to get an okay on his Pradaxa 150 mg prescription. Would you please call him and let him know what going on with his prescription.

## 2013-03-30 NOTE — Telephone Encounter (Signed)
Returned call and pt verified x 2.  Pt stated his mail order pharmacy said they haven't received a refill for Pradaxa.  Pt informed other meds received, but not for Pradaxa.  Informed RN will send now w/o refills as he must keep his appt in March.  Pt verbalized understanding and agreed w/ plan.  Refill(s) sent to pharmacy: [facility].

## 2013-05-05 ENCOUNTER — Encounter: Payer: Self-pay | Admitting: Cardiovascular Disease

## 2013-05-05 ENCOUNTER — Ambulatory Visit (INDEPENDENT_AMBULATORY_CARE_PROVIDER_SITE_OTHER): Payer: Managed Care, Other (non HMO) | Admitting: Cardiovascular Disease

## 2013-05-05 VITALS — BP 148/88 | HR 46 | Ht 70.0 in | Wt 206.0 lb

## 2013-05-05 DIAGNOSIS — I4891 Unspecified atrial fibrillation: Secondary | ICD-10-CM

## 2013-05-05 DIAGNOSIS — I10 Essential (primary) hypertension: Secondary | ICD-10-CM | POA: Insufficient documentation

## 2013-05-05 DIAGNOSIS — I2510 Atherosclerotic heart disease of native coronary artery without angina pectoris: Secondary | ICD-10-CM

## 2013-05-05 DIAGNOSIS — Z79899 Other long term (current) drug therapy: Secondary | ICD-10-CM

## 2013-05-05 DIAGNOSIS — E785 Hyperlipidemia, unspecified: Secondary | ICD-10-CM | POA: Insufficient documentation

## 2013-05-05 DIAGNOSIS — E782 Mixed hyperlipidemia: Secondary | ICD-10-CM

## 2013-05-05 DIAGNOSIS — I480 Paroxysmal atrial fibrillation: Secondary | ICD-10-CM

## 2013-05-05 MED ORDER — AMIODARONE HCL 200 MG PO TABS: 100.0000 mg | ORAL_TABLET | Freq: Every day | ORAL | Status: DC

## 2013-05-05 NOTE — Assessment & Plan Note (Signed)
On statin therapy. We will recheck a lipid and liver profile 

## 2013-05-05 NOTE — Assessment & Plan Note (Signed)
noncritical CAD by cath performed 01/21/04 by Dr. Lenise Heraldobert McQueen . He had 30% proximal RCA and 30% proximal LAD with an EF of 40% at that time. His most recent EF by 2-D echo performed 07/02/12 and was entirely normal. He denies chest pain or shortness of breath.

## 2013-05-05 NOTE — Assessment & Plan Note (Signed)
Maintaining sinus rhythm on amiodarone and Pradaxa . Am going to decrease his amiodarone from 200-100 mg a day.

## 2013-05-05 NOTE — Progress Notes (Signed)
05/05/2013 Dennis Snyder   03/07/1950  [id number]  Primary Physician Cassell SmilesFUSCO,LAWRENCE J., MD Primary Cardiologist: Runell GessJonathan J. Ceairra Mccarver MD Dennis RenoFACP,FACC,FAHA, FSCAI   HPI:  The patient is a 63 -year-old mildly-overweight divorced Caucasian male, father of 1 child, whom I last saw approximately 1 year ago. 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 His risk factors include hypertension and hyperlipidemia. He was catheterized by Dr. Lenise Heraldobert McQueen January 21, 2004, revealing noncritical CAD with moderate LV dysfunction. His EF was 40% with segmental wall motion abnormalities. He also had paroxysmal atrial fibrillation, on Pradaxa and amiodarone maintaining sinus rhythm. He is otherwise asymptomatic. His last lipid profile was performed over a year ago revealing a total cholesterol of 141, LDL 82 HDL 47. Since I saw him a year ago he's remained completely asymptomatic..      Current Outpatient Prescriptions  Medication Sig Dispense Refill  . amiodarone (PACERONE) 200 MG tablet Take 0.5 tablets (100 mg total) by mouth daily.  30 tablet  6  . Ascorbic Acid (VITAMIN C) 1000 MG tablet Take 1,000 mg by mouth daily.      Marland Kitchen. aspirin 81 MG tablet Take 81 mg by mouth daily.      . Coenzyme Q10 (CO Q-10) 100 MG CAPS Take 200 mg by mouth daily.       . dabigatran (PRADAXA) 150 MG CAPS capsule Take 1 capsule (150 mg total) by mouth every 12 (twelve) hours.  180 capsule  0  . fenofibrate 160 MG tablet TAKE 1 TABLET BY MOUTH DAILY AT BEDTIME  90 tablet  0  . fish oil-omega-3 fatty acids 1000 MG capsule Take 2 g by mouth daily.      . Glucosamine 500 MG CAPS Take 1,500 mg by mouth daily. With Chrondrotin 1200 mg      . lisinopril (PRINIVIL,ZESTRIL) 5 MG tablet Take 1 tablet (5 mg total) by mouth daily.  90 tablet  0  . MAGNESIUM CARBONATE PO Take 1 tablet by mouth daily.      . Multiple Vitamin (MULTIVITAMIN) capsule Take 1 capsule by mouth daily.      . simvastatin (ZOCOR) 20 MG tablet TAKE 1 TABLET BY MOUTH DAILY AT  BEDTIME  90 tablet  0  . Tamsulosin HCl (FLOMAX) 0.4 MG CAPS Take 0.4 mg by mouth daily.       . verapamil (VERELAN PM) 120 MG 24 hr capsule TAKE 1 CAPSULE BY MOUTH DAILY  90 capsule  0   No current facility-administered medications for this visit.    Allergies  Allergen Reactions  . Benadryl [Diphenhydramine Hcl] Other (See Comments)    Severe drowsiness that lasted 24 hours.    History   Social History  . Marital Status: Divorced    Spouse Name: N/A    Number of Children: N/A  . Years of Education: N/A   Occupational History  . [employer]    Social History Main Topics  . Smoking status: Never Smoker   . Smokeless tobacco: Not on file  . Alcohol Use: Yes     Comment: socially  . Drug Use: No  . Sexual Activity: Not on file   Other Topics Concern  . Not on file   Social History Narrative  . No narrative on file     Review of Systems: General: negative for chills, fever, night sweats or weight changes.  Cardiovascular: negative for chest pain, dyspnea on exertion, edema, orthopnea, palpitations, paroxysmal nocturnal dyspnea or shortness of breath Dermatological:  negative for rash Respiratory: negative for cough or wheezing Urologic: negative for hematuria Abdominal: negative for nausea, vomiting, diarrhea, bright red blood per rectum, melena, or hematemesis Neurologic: negative for visual changes, syncope, or dizziness All other systems reviewed and are otherwise negative except as noted above.    Blood pressure 148/88, pulse 46, height 5\' 10"  (1.778 m), weight 93.441 kg (206 lb).  General appearance: alert and no distress Neck: no adenopathy, no carotid bruit, no JVD, supple, symmetrical, trachea midline and thyroid not enlarged, symmetric, no tenderness/mass/nodules Lungs: clear to auscultation bilaterally Heart: regular rate and rhythm, S1, S2 normal, no murmur, click, rub or gallop Extremities: extremities normal, atraumatic, no cyanosis or edema  EKG  sinus bradycardia at 46 with inferolateral T-wave inversion.  ASSESSMENT AND PLAN:   Coronary artery disease noncritical CAD by cath performed 01/21/04 by Dr. Lenise Herald . He had 30% proximal RCA and 30% proximal LAD with an EF of 40% at that time. His most recent EF by 2-D echo performed 07/02/12 and was entirely normal. He denies chest pain or shortness of breath.  Essential hypertension Well-controlled on current medications  Hyperlipidemia On statin therapy. We will recheck a lipid and liver profile  Paroxysmal atrial fibrillation Maintaining sinus rhythm on amiodarone and Pradaxa . Am going to decrease his amiodarone from 200-100 mg a day.      Runell Gess MD FACP,FACC,FAHA, [facility] 05/05/2013 8:37 AM

## 2013-05-05 NOTE — Patient Instructions (Signed)
  We will see you back in follow up in 1 year with Dr Allyson SabalBerry.  Dr Allyson SabalBerry has ordered for you to have some blood work done, fasting.  This can be done at your convenience.   Dr Allyson SabalBerry wants you to decrease the Amiodarone to 100mg  daily.  Just cut your 200mg  tablet in half.

## 2013-05-05 NOTE — Assessment & Plan Note (Signed)
Well-controlled on current medications 

## 2013-06-07 ENCOUNTER — Other Ambulatory Visit: Payer: Self-pay | Admitting: Cardiovascular Disease

## 2013-06-08 NOTE — Telephone Encounter (Signed)
Prescriptions sent into pharmacy for Simvastatin, Lisinopril, Fenofibrate, and Verapamil. Pradaxa routed to Wolfgang PhoenixK Alvstad, PharmD for approval.

## 2013-06-16 LAB — HEPATIC FUNCTION PANEL
ALBUMIN: 4.2 g/dL (ref 3.5–5.2)
ALT: 22 U/L (ref 0–53)
AST: 20 U/L (ref 0–37)
Alkaline Phosphatase: 41 U/L (ref 39–117)
BILIRUBIN INDIRECT: 0.3 mg/dL (ref 0.2–1.2)
Bilirubin, Direct: 0.2 mg/dL (ref 0.0–0.3)
TOTAL PROTEIN: 6.9 g/dL (ref 6.0–8.3)
Total Bilirubin: 0.5 mg/dL (ref 0.2–1.2)

## 2013-06-16 LAB — LIPID PANEL
Cholesterol: 112 mg/dL (ref 0–200)
HDL: 40 mg/dL (ref >39)
LDL CALC: 59 mg/dL (ref 0–99)
TRIGLYCERIDES: 63 mg/dL (ref <150)
Total CHOL/HDL Ratio: 2.8 Ratio
VLDL: 13 mg/dL (ref 0–40)

## 2013-06-17 ENCOUNTER — Encounter: Payer: Self-pay | Admitting: *Deleted

## 2013-08-11 ENCOUNTER — Encounter (INDEPENDENT_AMBULATORY_CARE_PROVIDER_SITE_OTHER): Payer: Self-pay | Admitting: Surgery

## 2013-08-21 ENCOUNTER — Ambulatory Visit (INDEPENDENT_AMBULATORY_CARE_PROVIDER_SITE_OTHER): Payer: Managed Care, Other (non HMO) | Admitting: Surgery

## 2013-08-26 NOTE — H&P (Signed)
  NTS SOAP Note  Vital Signs:  Vitals as of: 08/25/2013: Systolic 126: Diastolic 78: Heart Rate 73: Temp 66F: Height 245ft 11in: Weight 207Lbs 0 Ounces: Pain Level 2: BMI 28.87  BMI : 28.87 kg/m2  Subjective: This [age] Years 464 Months old Male presents for of a right inguinal hernia.  Has been present for some time,  but is increasing in size and causing him discomfort.  Review of Symptoms:  Constitutional:unremarkable   Head:unremarkable    Eyes:unremarkable   sinus problems Cardiovascular:  unremarkable   Respiratory:  dyspnea Gastrointestinal:  unremarkable   Genitourinary:    frequency   joint, neck, and back pain Skin:unremarkable Hematolgic/Lymphatic:chronically anticoagulated     Allergic/Immunologic:unremarkable     Past Medical History:    Reviewed  Past Medical History  Surgical History: none Medical Problems: h/o atrial fib, HTN Allergies: benadryl Medications: verapaminl, simvastatin, tamsulosin, lisinopril, fneofibrate, amiodarone, pradaxa, baby asa, coQ   Social History:Reviewed  Social History  Preferred Language: English Race:  White Ethnicity: Not Hispanic / Latino Age: [age] Years 4 Months Marital Status:  S   Smoking Status: Never smoker reviewed on 08/25/2013 Functional Status reviewed on 08/25/2013 ------------------------------------------------ Bathing: Normal Cooking: Normal Dressing: Normal Driving: Normal Eating: Normal Managing Meds: Normal Oral Care: Normal Shopping: Normal Toileting: Normal Transferring: Normal Walking: Normal Cognitive Status reviewed on 08/25/2013 ------------------------------------------------ Attention: Normal Decision Making: Normal Language: Normal Memory: Normal Motor: Normal Perception: Normal Problem Solving: Normal Visual and Spatial: Normal   Family History:  Reviewed  Family Health History Mother, Deceased; Lung cancer;  Father, Deceased; Lung  cancer;     Objective Information: General:  Well appearing, well nourished in no distress. Neck:  Supple without lymphadenopathy.    Extra beats noted, no s3 s4 Lungs:    CTA bilaterally, no wheezes, rhonchi, rales.  Breathing unlabored. Abdomen:Soft, NT/ND, no HSM, no masses.  Reducible right inguinal hernia noted. YN:[id number]:unremarkable    Assessment:Right inguinal hernia  Diagnoses: 550.90 Inguinal hernia (Unilateral inguinal hernia, without obstruction or gangrene, not specified as recurrent)  Procedures: [id number] - OFFICE OUTPATIENT NEW 30 MINUTES    Plan:  Scheduled for right inguinal herniorrhaphy with mesh on 09/10/14.  Will stop pradaxa, asa three days prior to surgery.   Patient Education:Alternative treatments to surgery were discussed with patient (and family).  Risks and benefits  of procedure including bleeding, infection, mesh use, and the possibility of recurrence of the hernia were fully explained to the patient (and family) who gave informed consent. Patient/family questions were addressed.  Follow-up:Pending Surgery

## 2013-08-27 ENCOUNTER — Telehealth: Payer: Self-pay | Admitting: Cardiovascular Disease

## 2013-08-27 NOTE — Telephone Encounter (Signed)
Pt is going to have hernia surgery on 09-09-13. When should he stop his Pradaxa and for how long?

## 2013-08-27 NOTE — Telephone Encounter (Signed)
Dr Berry, please advise 

## 2013-08-27 NOTE — Telephone Encounter (Signed)
Inguinal surgical repair by Dr. [place] 09/09/13.  Anesthesia told patient to HOLD Pradaxa x 72 hours prior to surgery.  Patient calling to make sure this is OK.  Message sent to Dr. Allyson SabalBerry and Samara DeistKathryn.

## 2013-08-28 ENCOUNTER — Other Ambulatory Visit: Payer: Self-pay | Admitting: Cardiovascular Disease

## 2013-08-28 NOTE — Telephone Encounter (Signed)
This is okay.

## 2013-08-31 NOTE — Telephone Encounter (Signed)
Patient notified Dr. Allyson SabalBerry OK'd him to hold the Pradaxa 72 hours prior to surgery and restart ASAP.  Patient voiced understanding.

## 2013-09-03 ENCOUNTER — Encounter (HOSPITAL_COMMUNITY): Payer: Self-pay | Admitting: Pharmacy Technician

## 2013-09-03 ENCOUNTER — Encounter (HOSPITAL_COMMUNITY): Payer: Self-pay

## 2013-09-03 ENCOUNTER — Encounter (HOSPITAL_COMMUNITY)
Admission: RE | Admit: 2013-09-03 | Discharge: 2013-09-03 | Disposition: A | Discharge status: Home / Self Care | Payer: Managed Care, Other (non HMO) | Source: Ambulatory Visit | Attending: General Surgery | Admitting: General Surgery

## 2013-09-03 DIAGNOSIS — Z01812 Encounter for preprocedural laboratory examination: Secondary | ICD-10-CM | POA: Insufficient documentation

## 2013-09-03 LAB — CBC WITH DIFFERENTIAL/PLATELET
Basophils Absolute: 0.1 10*3/uL (ref 0.0–0.1)
Basophils Relative: 1 % (ref 0–1)
Eosinophils Absolute: 0.3 10*3/uL (ref 0.0–0.7)
Eosinophils Relative: 5 % (ref 0–5)
HCT: 41 % (ref 39.0–52.0)
HEMOGLOBIN: 13.9 g/dL (ref 13.0–17.0)
LYMPHS ABS: 1.7 10*3/uL (ref 0.7–4.0)
Lymphocytes Relative: 28 % (ref 12–46)
MCH: 32.9 pg (ref 26.0–34.0)
MCHC: 33.9 g/dL (ref 30.0–36.0)
MCV: 97.2 fL (ref 78.0–100.0)
MONOS PCT: 15 % — AB (ref 3–12)
Monocytes Absolute: 0.9 10*3/uL (ref 0.1–1.0)
NEUTROS ABS: 3.2 10*3/uL (ref 1.7–7.7)
NEUTROS PCT: 51 % (ref 43–77)
Platelets: 238 10*3/uL (ref 150–400)
RBC: 4.22 MIL/uL (ref 4.22–5.81)
RDW: 14.7 % (ref 11.5–15.5)
WBC: 6.2 10*3/uL (ref 4.0–10.5)

## 2013-09-03 LAB — BASIC METABOLIC PANEL
ANION GAP: 12 (ref 5–15)
BUN: 31 mg/dL — ABNORMAL HIGH (ref 6–23)
CHLORIDE: 108 meq/L (ref 96–112)
CO2: 25 mEq/L (ref 19–32)
Calcium: 9.6 mg/dL (ref 8.4–10.5)
Creatinine, Ser: 1.18 mg/dL (ref 0.50–1.35)
GFR calc non Af Amer: 64 mL/min — ABNORMAL LOW (ref >90)
GFR, EST AFRICAN AMERICAN: 74 mL/min — AB (ref >90)
Glucose, Bld: 111 mg/dL — ABNORMAL HIGH (ref 70–99)
POTASSIUM: 4.2 meq/L (ref 3.7–5.3)
SODIUM: 145 meq/L (ref 137–147)

## 2013-09-03 NOTE — Patient Instructions (Signed)
Benay Spicernest R Lyday  09/03/2013   Your procedure is scheduled on:  09/09/2013  [facility] at  700 AM.  Call this number if you have problems the morning of surgery: [phone]   Remember:   Do not eat food or drink liquids after midnight.   Take these medicines the morning of surgery with A SIP OF WATER:  Amiodorone, fenofibrate, lisinopril, verapamil   Do not wear jewelry, make-up or nail polish.  Do not wear lotions, powders, or perfumes.   Do not shave 48 hours prior to surgery. Men may shave face and neck.  Do not bring valuables to the hospital.  [facility] is not responsible  for any belongings or valuables.               Contacts, dentures or bridgework may not be worn into surgery.  Leave suitcase in the car. After surgery it may be brought to your room.  For patients admitted to the hospital, discharge time is determined by your treatment team.               Patients discharged the day of surgery will not be allowed to drive home.  Name and phone number of your driver: family  Special Instructions: Shower using CHG 2 nights before surgery and the night before surgery.  If you shower the day of surgery use CHG.  Use special wash - you have one bottle of CHG for all showers.  You should use approximately 1/3 of the bottle for each shower.   Please read over the following fact sheets that you were given: Pain Booklet, Coughing and Deep Breathing, Surgical Site Infection Prevention, Anesthesia Post-op Instructions and Care and Recovery After Surgery Hernia A hernia happens when an organ inside your body pushes out through a weak spot in your belly (abdominal) wall. Most hernias get worse over time. They can often be pushed back into place (reduced). Surgery may be needed to repair hernias that cannot be pushed into place. HOME CARE  Keep doing normal activities.  Avoid lifting more than 10 pounds (4.5 kilograms).  Cough gently and avoid straining. Over time, these  things will:  Increase your hernia size.  Irritate your hernia.  Break down hernia repairs.  Stop smoking.  Do not wear anything tight over your hernia. Do not keep the hernia in with an outside bandage.  Eat food that is high in fiber (fruit, vegetables, whole grains).  Drink enough fluids to keep your pee (urine) clear or pale yellow.  Take medicines to make your poop soft (stool softeners) if you cannot poop (constipated). GET HELP RIGHT AWAY IF:   You have a fever.  You have belly pain that gets worse.  You feel sick to your stomach (nauseous) and throw up (vomit).  Your skin starts to bulge out.  Your hernia turns a different color, feels hard, or is tender.  You have increased pain or puffiness (swelling) around the hernia.  You poop more or less often.  Your poop does not look the way normally does.  You have watery poop (diarrhea).  You cannot push the hernia back in place by applying gentle pressure while lying down. MAKE SURE YOU:   Understand these instructions.  Will watch your condition.  Will get help right away if you are not doing well or get worse. Document Released: 08/09/2009 Document Revised: 05/14/2011 Document Reviewed: 08/09/2009 [facility] Patient Information 2015 [place] [employer] [place]. This information is not  intended to replace advice given to you by your health care provider. Make sure you discuss any questions you have with your health care provider. PATIENT INSTRUCTIONS POST-ANESTHESIA  IMMEDIATELY FOLLOWING SURGERY:  Do not drive or operate machinery for the first twenty four hours after surgery.  Do not make any important decisions for twenty four hours after surgery or while taking narcotic pain medications or sedatives.  If you develop intractable nausea and vomiting or a severe headache please notify your doctor immediately.  FOLLOW-UP:  Please make an appointment with your surgeon as instructed. You do not need to follow up with  anesthesia unless specifically instructed to do so.  WOUND CARE INSTRUCTIONS (if applicable):  Keep a dry clean dressing on the anesthesia/puncture wound site if there is drainage.  Once the wound has quit draining you may leave it open to air.  Generally you should leave the bandage intact for twenty four hours unless there is drainage.  If the epidural site drains for more than 36-48 hours please call the anesthesia department.  QUESTIONS?:  Please feel free to call your physician or the hospital operator if you have any questions, and they will be happy to assist you.

## 2013-09-03 NOTE — Pre-Procedure Instructions (Signed)
Pt instructed by Dr Lovell SheehanJenkins to stop Pradaxa Sunday.  Dr Jayme CloudGonzalez aware.

## 2013-09-09 ENCOUNTER — Encounter (HOSPITAL_COMMUNITY): Payer: Self-pay | Admitting: *Deleted

## 2013-09-09 ENCOUNTER — Encounter (HOSPITAL_COMMUNITY): Payer: Managed Care, Other (non HMO) | Admitting: Anesthesiology

## 2013-09-09 ENCOUNTER — Ambulatory Visit (HOSPITAL_COMMUNITY): Payer: Managed Care, Other (non HMO) | Admitting: Anesthesiology

## 2013-09-09 ENCOUNTER — Ambulatory Visit (HOSPITAL_COMMUNITY)
Admission: RE | Admit: 2013-09-09 | Discharge: 2013-09-09 | Disposition: A | Discharge status: Home / Self Care | Payer: Managed Care, Other (non HMO) | Source: Ambulatory Visit | Attending: General Surgery | Admitting: General Surgery

## 2013-09-09 ENCOUNTER — Encounter (HOSPITAL_COMMUNITY)
Admission: RE | Disposition: A | Discharge status: Home / Self Care | Payer: Self-pay | Source: Ambulatory Visit | Attending: General Surgery

## 2013-09-09 DIAGNOSIS — I10 Essential (primary) hypertension: Secondary | ICD-10-CM | POA: Insufficient documentation

## 2013-09-09 DIAGNOSIS — I4891 Unspecified atrial fibrillation: Secondary | ICD-10-CM | POA: Insufficient documentation

## 2013-09-09 DIAGNOSIS — Z7982 Long term (current) use of aspirin: Secondary | ICD-10-CM | POA: Insufficient documentation

## 2013-09-09 DIAGNOSIS — K4090 Unilateral inguinal hernia, without obstruction or gangrene, not specified as recurrent: Secondary | ICD-10-CM | POA: Insufficient documentation

## 2013-09-09 DIAGNOSIS — Z79899 Other long term (current) drug therapy: Secondary | ICD-10-CM | POA: Insufficient documentation

## 2013-09-09 HISTORY — PX: INGUINAL HERNIA REPAIR: SHX194

## 2013-09-09 HISTORY — PX: INSERTION OF MESH: SHX5868

## 2013-09-09 SURGERY — REPAIR, HERNIA, INGUINAL, ADULT
Anesthesia: General | Site: Abdomen | Laterality: Right

## 2013-09-09 MED ORDER — FENTANYL CITRATE 0.05 MG/ML IJ SOLN
INTRAMUSCULAR | Status: DC | PRN
  Administered 2013-09-09 (×3): 25 ug via INTRAVENOUS

## 2013-09-09 MED ORDER — BUPIVACAINE LIPOSOME 1.3 % IJ SUSP
INTRAMUSCULAR | Status: DC | PRN
  Administered 2013-09-09: 20 mL

## 2013-09-09 MED ORDER — ONDANSETRON HCL 4 MG/2ML IJ SOLN
INTRAMUSCULAR | Status: AC
  Filled 2013-09-09: qty 2

## 2013-09-09 MED ORDER — HYDROCODONE-ACETAMINOPHEN 5-325 MG PO TABS: 1.0000 | ORAL_TABLET | ORAL | Status: AC | PRN

## 2013-09-09 MED ORDER — BUPIVACAINE LIPOSOME 1.3 % IJ SUSP
INTRAMUSCULAR | Status: AC
  Filled 2013-09-09: qty 20

## 2013-09-09 MED ORDER — PROPOFOL 10 MG/ML IV EMUL
INTRAVENOUS | Status: AC
  Filled 2013-09-09: qty 20

## 2013-09-09 MED ORDER — CEFAZOLIN SODIUM-DEXTROSE 2-3 GM-% IV SOLR
2.0000 g | INTRAVENOUS | Status: AC
  Administered 2013-09-09: 2 g via INTRAVENOUS
  Filled 2013-09-09: qty 50

## 2013-09-09 MED ORDER — MIDAZOLAM HCL 2 MG/2ML IJ SOLN
INTRAMUSCULAR | Status: AC
  Filled 2013-09-09: qty 2

## 2013-09-09 MED ORDER — KETOROLAC TROMETHAMINE 30 MG/ML IJ SOLN
30.0000 mg | Freq: Once | INTRAMUSCULAR | Status: AC
  Administered 2013-09-09: 30 mg via INTRAVENOUS
  Filled 2013-09-09: qty 1

## 2013-09-09 MED ORDER — FENTANYL CITRATE 0.05 MG/ML IJ SOLN
25.0000 ug | INTRAMUSCULAR | Status: DC | PRN
  Administered 2013-09-09 (×2): 25 ug via INTRAVENOUS

## 2013-09-09 MED ORDER — MIDAZOLAM HCL 2 MG/2ML IJ SOLN: 1.0000 mg | INTRAMUSCULAR | Status: DC | PRN

## 2013-09-09 MED ORDER — LACTATED RINGERS IV SOLN
INTRAVENOUS | Status: DC
  Administered 2013-09-09 (×2): via INTRAVENOUS

## 2013-09-09 MED ORDER — LACTATED RINGERS IV SOLN: INTRAVENOUS | Status: DC

## 2013-09-09 MED ORDER — SODIUM CHLORIDE 0.9 % IR SOLN
Status: DC | PRN
  Administered 2013-09-09: 1000 mL

## 2013-09-09 MED ORDER — PHENYLEPHRINE HCL 10 MG/ML IJ SOLN
INTRAMUSCULAR | Status: DC | PRN
  Administered 2013-09-09: 50 ug via INTRAVENOUS
  Administered 2013-09-09: 100 ug via INTRAVENOUS
  Administered 2013-09-09 (×2): 50 ug via INTRAVENOUS
  Administered 2013-09-09: 100 ug via INTRAVENOUS
  Administered 2013-09-09: 50 ug via INTRAVENOUS

## 2013-09-09 MED ORDER — GLYCOPYRROLATE 0.2 MG/ML IJ SOLN
INTRAMUSCULAR | Status: AC
  Filled 2013-09-09: qty 1

## 2013-09-09 MED ORDER — FENTANYL CITRATE 0.05 MG/ML IJ SOLN
25.0000 ug | INTRAMUSCULAR | Status: AC
  Administered 2013-09-09 (×2): 25 ug via INTRAVENOUS

## 2013-09-09 MED ORDER — FENTANYL CITRATE 0.05 MG/ML IJ SOLN
INTRAMUSCULAR | Status: AC
  Filled 2013-09-09: qty 2

## 2013-09-09 MED ORDER — PROPOFOL 10 MG/ML IV BOLUS
INTRAVENOUS | Status: DC | PRN
Start: 2013-09-09 — End: 2013-09-09
  Administered 2013-09-09: 50 mg via INTRAVENOUS
  Administered 2013-09-09: 100 mg via INTRAVENOUS

## 2013-09-09 MED ORDER — GLYCOPYRROLATE 0.2 MG/ML IJ SOLN
0.2000 mg | Freq: Once | INTRAMUSCULAR | Status: AC
  Administered 2013-09-09: 0.2 mg via INTRAVENOUS

## 2013-09-09 MED ORDER — ROCURONIUM BROMIDE 50 MG/5ML IV SOLN
INTRAVENOUS | Status: AC
  Filled 2013-09-09: qty 2

## 2013-09-09 MED ORDER — FENTANYL CITRATE 0.05 MG/ML IJ SOLN
INTRAMUSCULAR | Status: AC
  Filled 2013-09-09: qty 5

## 2013-09-09 MED ORDER — LIDOCAINE HCL 1 % IJ SOLN
INTRAMUSCULAR | Status: DC | PRN
Start: 2013-09-09 — End: 2013-09-09
  Administered 2013-09-09: 30 mg via INTRADERMAL

## 2013-09-09 MED ORDER — ONDANSETRON HCL 4 MG/2ML IJ SOLN
4.0000 mg | Freq: Once | INTRAMUSCULAR | Status: AC
  Administered 2013-09-09: 4 mg via INTRAVENOUS

## 2013-09-09 MED ORDER — LIDOCAINE HCL (PF) 1 % IJ SOLN
INTRAMUSCULAR | Status: AC
  Filled 2013-09-09: qty 10

## 2013-09-09 MED ORDER — ONDANSETRON HCL 4 MG/2ML IJ SOLN: 4.0000 mg | Freq: Once | INTRAMUSCULAR | Status: DC | PRN

## 2013-09-09 SURGICAL SUPPLY — 44 items
BAG HAMPER (MISCELLANEOUS) ×3 IMPLANT
BLADE 10 SAFETY STRL DISP (BLADE) IMPLANT
CLOTH BEACON ORANGE TIMEOUT ST (SAFETY) ×3 IMPLANT
COVER LIGHT HANDLE STERIS (MISCELLANEOUS) ×6 IMPLANT
DECANTER SPIKE VIAL GLASS SM (MISCELLANEOUS) ×3 IMPLANT
DERMABOND ADVANCED (GAUZE/BANDAGES/DRESSINGS) ×2
DERMABOND ADVANCED .7 DNX12 (GAUZE/BANDAGES/DRESSINGS) ×1 IMPLANT
DRAIN PENROSE 18X1/2 LTX STRL (DRAIN) ×3 IMPLANT
ELECT REM PT RETURN 9FT ADLT (ELECTROSURGICAL) ×3
ELECTRODE REM PT RTRN 9FT ADLT (ELECTROSURGICAL) ×1 IMPLANT
FORMALIN 10 PREFIL 120ML (MISCELLANEOUS) IMPLANT
GLOVE BIOGEL PI IND STRL 6.5 (GLOVE) ×1 IMPLANT
GLOVE BIOGEL PI IND STRL 7.0 (GLOVE) ×1 IMPLANT
GLOVE BIOGEL PI IND STRL 7.5 (GLOVE) ×1 IMPLANT
GLOVE BIOGEL PI INDICATOR 6.5 (GLOVE) ×2
GLOVE BIOGEL PI INDICATOR 7.0 (GLOVE) ×2
GLOVE BIOGEL PI INDICATOR 7.5 (GLOVE) ×2
GLOVE ECLIPSE 6.5 STRL STRAW (GLOVE) ×6 IMPLANT
GLOVE ECLIPSE 7.0 STRL STRAW (GLOVE) ×3 IMPLANT
GLOVE EXAM NITRILE MD LF STRL (GLOVE) ×3 IMPLANT
GLOVE SURG SS PI 7.5 STRL IVOR (GLOVE) ×3 IMPLANT
GOWN STRL REUS W/TWL LRG LVL3 (GOWN DISPOSABLE) ×9 IMPLANT
INST SET MINOR GENERAL (KITS) ×3 IMPLANT
KIT ROOM TURNOVER APOR (KITS) ×3 IMPLANT
MANIFOLD NEPTUNE II (INSTRUMENTS) ×3 IMPLANT
MESH HERNIA 1.6X1.9 PLUG LRG (Mesh General) ×1 IMPLANT
MESH HERNIA PLUG LRG (Mesh General) ×2 IMPLANT
NS IRRIG 1000ML POUR BTL (IV SOLUTION) ×3 IMPLANT
PACK MINOR (CUSTOM PROCEDURE TRAY) ×3 IMPLANT
PAD ARMBOARD 7.5X6 YLW CONV (MISCELLANEOUS) ×3 IMPLANT
SET BASIN LINEN APH (SET/KITS/TRAYS/PACK) ×3 IMPLANT
SOL PREP PROV IODINE SCRUB 4OZ (MISCELLANEOUS) ×3 IMPLANT
SUT NOVA NAB GS-22 2 2-0 T-19 (SUTURE) ×9 IMPLANT
SUT NOVAFIL NAB HGS22 2-0 30IN (SUTURE) IMPLANT
SUT SILK 3 0 (SUTURE)
SUT SILK 3-0 18XBRD TIE 12 (SUTURE) IMPLANT
SUT VIC AB 2-0 CT1 27 (SUTURE) ×2
SUT VIC AB 2-0 CT1 TAPERPNT 27 (SUTURE) ×1 IMPLANT
SUT VIC AB 3-0 SH 27 (SUTURE) ×2
SUT VIC AB 3-0 SH 27X BRD (SUTURE) ×1 IMPLANT
SUT VIC AB 4-0 PS2 27 (SUTURE) ×3 IMPLANT
SUT VICRYL AB 3 0 TIES (SUTURE) IMPLANT
SYR 20CC LL (SYRINGE) ×3 IMPLANT
TOWEL OR 17X26 4PK STRL BLUE (TOWEL DISPOSABLE) ×3 IMPLANT

## 2013-09-09 NOTE — Anesthesia Procedure Notes (Signed)
Procedure Name: LMA Insertion Date/Time: 09/09/2013 8:47 AM Performed by: Glynn OctaveANIEL, Dennis Snyder Pre-anesthesia Checklist: Patient identified, Patient being monitored, Emergency Drugs available, Timeout performed and Suction available Patient Re-evaluated:Patient Re-evaluated prior to inductionOxygen Delivery Method: Circle System Utilized Preoxygenation: Pre-oxygenation with 100% oxygen Intubation Type: IV induction Ventilation: Mask ventilation without difficulty LMA: LMA inserted LMA Size: 4.0 and 5.0 Number of attempts: 1 Placement Confirmation: positive ETCO2 and breath sounds checked- equal and bilateral Comments: LMA #4 - inadequatesealtherefore swithced out to #5 with leak at 20cm H2O

## 2013-09-09 NOTE — Anesthesia Preprocedure Evaluation (Signed)
Anesthesia Evaluation  Patient identified by MRN, date of birth, ID band Patient awake    Reviewed: Allergy & Precautions, H&P , NPO status , Patient's Chart, lab work & pertinent test results, reviewed documented beta blocker date and time   Airway Mallampati: II TM Distance: >3 FB     Dental  (+) Teeth Intact, Implants   Pulmonary neg pulmonary ROS,  breath sounds clear to auscultation        Cardiovascular hypertension, Pt. on medications - angina+ CAD, +CHF and + DOE + dysrhythmias Atrial Fibrillation Rhythm:Regular Rate:Normal     Neuro/Psych    GI/Hepatic negative GI ROS,   Endo/Other    Renal/GU      Musculoskeletal   Abdominal   Peds  Hematology   Anesthesia Other Findings   Reproductive/Obstetrics                           Anesthesia Physical Anesthesia Plan  ASA: III  Anesthesia Plan: General   Post-op Pain Management:    Induction: Intravenous  Airway Management Planned: LMA  Additional Equipment:   Intra-op Plan:   Post-operative Plan: Extubation in OR  Informed Consent: I have reviewed the patients History and Physical, chart, labs and discussed the procedure including the risks, benefits and alternatives for the proposed anesthesia with the patient or authorized representative who has indicated his/her understanding and acceptance.     Plan Discussed with:   Anesthesia Plan Comments:         Anesthesia Quick Evaluation

## 2013-09-09 NOTE — Op Note (Signed)
Patient:  Dennis Snyder  DOB:  06/08/1950  MRN:  [id number]   Preop Diagnosis:  Right inguinal hernia  Postop Diagnosis:  Same  Procedure:  Right inguinal herniorrhaphy with mesh  Surgeon:  Franky MachoMark Lallie Strahm, M.D.  Anes:  General  Indications:  Patient is a 63 year old white male who presents with a symptomatic right inguinal hernia. The risks and benefits of the procedure including bleeding, infection, and recurrence of the hernia were fully explained to the patient, who gave informed consent.  Procedure note:  The patient was placed in the supine position. After induction of general anesthesia, the right groin was prepped and draped using usual sterile technique with Betadine. Surgical site confirmation was performed.  A transverse incision was made in the right groin down to the external oblique aponeuroses. The aponeuroses was incised to the external ring. A Penrose drain was placed around the spermatic cord. The vas deferens was noted within the spermatic cord. A large indirect hernia was found and this was reduced at the peritoneal reflection. A large Bard PerFix plug was then inserted and secured to the transversalis fascia using 2-0 Novafil interrupted sutures. An onlay patch was then placed along the floor of the inguinal canal and secured superiorly to the conjoined tendon and inferiorly to the shelving edge of Poupart's ligament using 2-0 Novafil interrupted sutures. The internal ring was recreated using a 2-0 Novafil interrupted suture. The external oblique aponeuroses was reapproximated using a 2-0 Vicryl running suture. The subcutaneous layer was reapproximated using 3-0 Vicryl interrupted suture. The skin was closed using a 4-0 Vicryl subcuticular suture.  Exparel was instilled into the surrounding wound. Dermabond was then applied.  All tape and needle counts were correct at the end of the procedure. Patient was awakened and transferred to PACU in stable condition.  Complications:   None  EBL:  Minimal  Specimen:  None

## 2013-09-09 NOTE — Discharge Instructions (Signed)
Inguinal Hernia, Adult  °Care After °Refer to this sheet in the next few weeks. These discharge instructions provide you with general information on caring for yourself after you leave the hospital. Your caregiver may also give you specific instructions. Your treatment has been planned according to the most current medical practices available, but unavoidable complications sometimes occur. If you have any problems or questions after discharge, please call your caregiver. °HOME CARE INSTRUCTIONS °· Put ice on the operative site. °¨ Put ice in a plastic bag. °¨ Place a towel between your skin and the bag. °¨ Leave the ice on for 15-20 minutes at a time, 03-04 times a day while awake. °· Change bandages (dressings) as directed. °· Keep the wound dry and clean. The wound may be washed gently with soap and water. Gently blot or dab the wound dry. It is okay to take showers 24 to 48 hours after surgery. Do not take baths, use swimming pools, or use hot tubs for 10 days, or as directed by your caregiver. °· Only take over-the-counter or prescription medicines for pain, discomfort, or fever as directed by your caregiver. °· Continue your normal diet as directed. °· Do not lift anything more than 10 pounds or play contact sports for 3 weeks, or as directed. °SEEK MEDICAL CARE IF: °· There is redness, swelling, or increasing pain in the wound. °· There is fluid (pus) coming from the wound. °· There is drainage from a wound lasting longer than 1 day. °· You have an oral temperature above 102° F (38.9° C). °· You notice a bad smell coming from the wound or dressing. °· The wound breaks open after the stitches (sutures) have been removed. °· You notice increasing pain in the shoulders (shoulder strap areas). °· You develop dizzy episodes or fainting while standing. °· You feel sick to your stomach (nauseous) or throw up (vomit). °SEEK IMMEDIATE MEDICAL CARE IF: °· You develop a rash. °· You have difficulty breathing. °· You  develop a reaction or have side effects to medicines you were given. °MAKE SURE YOU:  °· Understand these instructions. °· Will watch your condition. °· Will get help right away if you are not doing well or get worse. °Document Released: 03/22/2006 Document Revised: 05/14/2011 Document Reviewed: 01/19/2009 °ExitCare® Patient Information ©2015 [employer]. This information is not intended to replace advice given to you by your health care provider. Make sure you discuss any questions you have with your health care provider. ° °

## 2013-09-09 NOTE — Transfer of Care (Signed)
Immediate Anesthesia Transfer of Care Note  Patient: Dennis Snyder  Procedure(s) Performed: Procedure(s): Right Inguinal Hernirrhaphy (Right) INSERTION OF MESH (Right)  Patient Location: PACU  Anesthesia Type:General  Level of Consciousness: awake  Airway & Oxygen Therapy: Patient Spontanous Breathing and Patient connected to face mask oxygen  Post-op Assessment: Report given to PACU RN  Post vital signs: Reviewed and stable  Complications: No apparent anesthesia complications

## 2013-09-09 NOTE — Interval H&P Note (Signed)
History and Physical Interval Note:  09/09/2013 8:30 AM  Dennis Snyder  has presented today for surgery, with the diagnosis of right inguinal hernia  The various methods of treatment have been discussed with the patient and family. After consideration of risks, benefits and other options for treatment, the patient has consented to  Procedure(s): HERNIA REPAIR INGUINAL ADULT (Right) as a surgical intervention .  The patient's history has been reviewed, patient examined, no change in status, stable for surgery.  I have reviewed the patient's chart and labs.  Questions were answered to the patient's satisfaction.     Franky MachoJENKINS,Eulonda Andalon A

## 2013-09-09 NOTE — Anesthesia Postprocedure Evaluation (Signed)
  Anesthesia Post-op Note  Patient: Dennis Snyder  Procedure(s) Performed: Procedure(s): Right Inguinal Hernirrhaphy (Right) INSERTION OF MESH (Right)  Patient Location: PACU  Anesthesia Type:General  Level of Consciousness: awake, alert  and oriented  Airway and Oxygen Therapy: Patient Spontanous Breathing and Patient connected to face mask oxygen  Post-op Pain: none  Post-op Assessment: Post-op Vital signs reviewed, Patient's Cardiovascular Status Stable, Respiratory Function Stable, Patent Airway and No signs of Nausea or vomiting  Post-op Vital Signs: Reviewed and stable  Last Vitals:  Filed Vitals:   09/09/13 0830  BP: 123/82  Temp:   Resp: 20    Complications: No apparent anesthesia complications

## 2013-09-10 ENCOUNTER — Encounter (HOSPITAL_COMMUNITY): Payer: Self-pay | Admitting: General Surgery

## 2013-10-05 ENCOUNTER — Telehealth: Payer: Self-pay | Admitting: Cardiovascular Disease

## 2013-10-05 NOTE — Telephone Encounter (Signed)
Patient has a question regarding his dosage of Amiodarone 200 mg.  He has been taking 1 whole tablet daily---when he received his mail order refill, the directions are for 1/2 tab daily.  Which is correct?

## 2013-10-05 NOTE — Telephone Encounter (Signed)
Spoke with patient. He states he most recent refill from [facility] home delivery was for 200mg  tablets with instructions to take 1/2 tablet daily. Patient states he did not realize this. RN informed him that at last OV with Dr. Allyson SabalBerry in March this medication change was made (200mg  decreased to 100mg ). Patient states he has enough of his medication to last him about 9 weeks. RN advised that he call back to office to request refill authorization about 2 weeks prior to running out of medications.   Will inform Dr. Allyson SabalBerry and Samara DeistKathryn, RN that patient had been taking 200mg  amiodarone since March, instead of the instructed 100mg . (FYI)

## 2013-11-09 ENCOUNTER — Other Ambulatory Visit: Payer: Self-pay | Admitting: Cardiovascular Disease

## 2013-11-10 NOTE — Telephone Encounter (Signed)
Rx was sent to pharmacy electronically. 

## 2014-02-15 ENCOUNTER — Telehealth: Payer: Self-pay | Admitting: Cardiovascular Disease

## 2014-02-15 ENCOUNTER — Encounter: Payer: Self-pay | Admitting: *Deleted

## 2014-02-15 NOTE — Telephone Encounter (Signed)
Letter composed and faxed to dentist office.

## 2014-02-15 NOTE — Telephone Encounter (Signed)
Darl PikesSusan is calling in wanting to know if the pt requires and pre-meds for a cleaning and if any surgery is needed what are some instructions on stopping his blood thinners. Please call   Thanks

## 2014-02-15 NOTE — Telephone Encounter (Signed)
Spoke with Darl PikesSusan & communicated this information.

## 2014-02-15 NOTE — Telephone Encounter (Signed)
Spoke with Darl PikesSusan @ [facility]. Patient is scheduled to have "deep cleaning" below the gum tomorrow. She said the dentist (Dr. Jasmine AweInsook Smith) needs in writing that patient will or will not need pre-medications and will need information r/t holding pradaxa for this cleaning if necessary. No surgery or dental extractions will be done at appointment on 12/15. Informed Darl PikesSusan, that to my knowledge and review of patient's history, no pre-meds are necessary, but that the question r/t to pradaxa will be deferred to MD to advise.   Message routed to DOD - Dr. Jens Somrenshaw to advise as Dr. Allyson SabalBerry (primary cardiologist) is not in office.   Fax [phone]

## 2014-02-15 NOTE — Telephone Encounter (Signed)
Hold pradaxa 2 days prior to procedure and resume day after. No SBE prophylaxis. Dennis MillersBrian Crenshaw

## 2014-05-19 ENCOUNTER — Other Ambulatory Visit: Payer: Self-pay | Admitting: Cardiovascular Disease

## 2014-05-19 MED ORDER — AMIODARONE HCL 200 MG PO TABS: 100.0000 mg | ORAL_TABLET | Freq: Every day | ORAL | Status: DC

## 2014-05-19 NOTE — Telephone Encounter (Signed)
Rx(s) sent to pharmacy electronically. Patient notified. 

## 2014-05-19 NOTE — Telephone Encounter (Signed)
°  1. Which medications need to be refilled? Amiodarone-new prescription until mail order comes in-Please call today  2. Which pharmacy is medication to be sent to?[facility]- [place]  3. Do they need a 30 day or 90 day supply? 30  4. Would they like a call back once the medication has been sent to the pharmacy? yes

## 2014-05-20 ENCOUNTER — Telehealth: Payer: Self-pay | Admitting: Cardiovascular Disease

## 2014-05-24 NOTE — Telephone Encounter (Signed)
Closed encounter °

## 2014-05-25 ENCOUNTER — Ambulatory Visit: Payer: Managed Care, Other (non HMO) | Admitting: Cardiovascular Disease

## 2014-06-13 ENCOUNTER — Other Ambulatory Visit: Payer: Self-pay | Admitting: Cardiovascular Disease

## 2014-06-25 ENCOUNTER — Ambulatory Visit: Payer: Managed Care, Other (non HMO) | Admitting: Cardiovascular Disease

## 2014-07-13 ENCOUNTER — Telehealth: Payer: Self-pay | Admitting: Cardiovascular Disease

## 2014-07-13 NOTE — Telephone Encounter (Signed)
Closed enocunter °

## 2014-07-23 ENCOUNTER — Telehealth: Payer: Self-pay | Admitting: Cardiovascular Disease

## 2014-07-23 NOTE — Telephone Encounter (Signed)
Pt having deep cleaning on Monday.  Advised he not stop Pradaxa.  Pt voiced understanding.

## 2014-07-23 NOTE — Telephone Encounter (Signed)
Pt is having dental work on Monday. He wants to know when is he suppose to stop taking his Pradaxa?

## 2014-07-27 ENCOUNTER — Ambulatory Visit: Payer: Managed Care, Other (non HMO) | Admitting: Cardiovascular Disease

## 2014-08-03 ENCOUNTER — Ambulatory Visit: Payer: Managed Care, Other (non HMO) | Admitting: Cardiovascular Disease

## 2014-08-19 ENCOUNTER — Other Ambulatory Visit: Payer: Self-pay | Admitting: Cardiovascular Disease

## 2014-08-20 NOTE — Telephone Encounter (Signed)
Rx(s) sent to pharmacy electronically.  

## 2014-09-07 ENCOUNTER — Ambulatory Visit: Payer: Managed Care, Other (non HMO) | Admitting: Cardiovascular Disease

## 2014-09-17 ENCOUNTER — Encounter: Payer: Self-pay | Admitting: *Deleted

## 2014-09-21 ENCOUNTER — Encounter: Payer: Self-pay | Admitting: Cardiovascular Disease

## 2014-09-21 ENCOUNTER — Ambulatory Visit (INDEPENDENT_AMBULATORY_CARE_PROVIDER_SITE_OTHER): Payer: Managed Care, Other (non HMO) | Admitting: Cardiovascular Disease

## 2014-09-21 VITALS — BP 122/78 | HR 67 | Ht 70.5 in | Wt 206.0 lb

## 2014-09-21 DIAGNOSIS — I480 Paroxysmal atrial fibrillation: Secondary | ICD-10-CM

## 2014-09-21 DIAGNOSIS — I2510 Atherosclerotic heart disease of native coronary artery without angina pectoris: Secondary | ICD-10-CM

## 2014-09-21 DIAGNOSIS — I10 Essential (primary) hypertension: Secondary | ICD-10-CM

## 2014-09-21 DIAGNOSIS — E785 Hyperlipidemia, unspecified: Secondary | ICD-10-CM

## 2014-09-21 NOTE — Patient Instructions (Signed)
Your physician recommends that you return for lab work at your earliest convenience - FASTING.  Dr Berry recommends that you schedule a follow-up appointment in 1 year. You will receive a reminder letter in the mail two months in advance. If you don't receive a letter, please call our office to schedule the follow-up appointment. 

## 2014-09-21 NOTE — Assessment & Plan Note (Signed)
History of hyperlipidemia on simvastatin 20 mg a day. His last lipid profile performed a year ago revealed a total cholesterol 112, LDL 59 and HDL 40. We will recheck a lipid and liver profile

## 2014-09-21 NOTE — Assessment & Plan Note (Signed)
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 Jenne CampusMcQueen. He does have moderate LV dysfunction with an ejection fraction of 40%. 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 He denies chest pain or shortness of breath.

## 2014-09-21 NOTE — Progress Notes (Signed)
09/21/2014 Dennis Snyder   04/29/1950  [id number]  Primary Physician Cassell SmilesFUSCO,LAWRENCE J., MD Primary Cardiologist: Runell GessJonathan J. Breyden Jeudy MD Roseanne RenoFACP,FACC,FAHA, FSCAI   HPI:   The patient is a 64 year old mildly-overweight divorced Caucasian male, father of 1 child, whom I last saw approximately 1 year ago. 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 His risk factors include hypertension and hyperlipidemia. He was catheterized by Dr. Lenise Heraldobert McQueen January 21, 2004, revealing noncritical CAD with moderate LV dysfunction. His EF was 40% with segmental wall motion abnormalities. He also had paroxysmal atrial fibrillation, on Pradaxa and amiodarone currently in A. Fib with a controlled ventricular response. He is otherwise asymptomatic. His last lipid profile was performed over a year ago revealing a total cholesterol of 112, LDL of 59 and HDL of 40. Since I saw him a year ago he's remained completely asymptomatic..    Current Outpatient Prescriptions  Medication Sig Dispense Refill  . amiodarone (PACERONE) 200 MG tablet Take 0.5 tablets (100 mg total) by mouth daily. 90 tablet 1  . Ascorbic Acid (VITAMIN C) 1000 MG tablet Take 1,000 mg by mouth daily.    Marland Kitchen. aspirin EC 81 MG tablet Take 81 mg by mouth daily.    . Coenzyme Q10 (CO Q-10) 100 MG CAPS Take 100 mg by mouth daily.     . fenofibrate 160 MG tablet TAKE 1 TABLET BY MOUTH DAILY 90 tablet 1  . fish oil-omega-3 fatty acids 1000 MG capsule Take 1 g by mouth daily.     . Glucosamine 500 MG CAPS Take 1,500 mg by mouth daily. With Chrondrotin 1200 mg    . lisinopril (PRINIVIL,ZESTRIL) 5 MG tablet TAKE 1 TABLET BY MOUTH DAILY 90 tablet 1  . MAGNESIUM CARBONATE PO Take 1 tablet by mouth daily.    . Multiple Vitamin (MULTIVITAMIN) capsule Take 1 capsule by mouth daily.    Marland Kitchen. PRADAXA 150 MG CAPS capsule TAKE 1 CAPSULE BY MOUTH EVERY 12 HOURS 180 capsule 1  . simvastatin (ZOCOR) 20 MG tablet TAKE 1 TABLET BY MOUTH DAILY 90 tablet 1  . Tamsulosin HCl (FLOMAX)  0.4 MG CAPS Take 0.4 mg by mouth daily.     . verapamil (VERELAN PM) 120 MG 24 hr capsule TAKE 1 CAPSULE BY MOUTH AT BEDTIME 90 capsule 1   No current facility-administered medications for this visit.    Allergies  Allergen Reactions  . Benadryl [Diphenhydramine Hcl] Other (See Comments)    Severe drowsiness that lasted 24 hours.    History   Social History  . Marital Status: Divorced    Spouse Name: N/A  . Number of Children: N/A  . Years of Education: N/A   Occupational History  . [employer]    Social History Main Topics  . Smoking status: Never Smoker   . Smokeless tobacco: Not on file  . Alcohol Use: Yes     Comment: socially  . Drug Use: No  . Sexual Activity: Not on file   Other Topics Concern  . Not on file   Social History Narrative     Review of Systems: General: negative for chills, fever, night sweats or weight changes.  Cardiovascular: negative for chest pain, dyspnea on exertion, edema, orthopnea, palpitations, paroxysmal nocturnal dyspnea or shortness of breath Dermatological: negative for rash Respiratory: negative for cough or wheezing Urologic: negative for hematuria Abdominal: negative for nausea, vomiting, diarrhea, bright red blood per rectum, melena, or hematemesis Neurologic: negative for visual changes, syncope, or dizziness All other  systems reviewed and are otherwise negative except as noted above.    Blood pressure 122/78, pulse 67, height 5' 10.5" (1.791 m), weight 206 lb (93.441 kg).  General appearance: alert and no distress Neck: no adenopathy, no carotid bruit, no JVD, supple, symmetrical, trachea midline and thyroid not enlarged, symmetric, no tenderness/mass/nodules Lungs: clear to auscultation bilaterally Heart: irregularly irregular rhythm Extremities: extremities normal, atraumatic, no cyanosis or edema  EKG atrial fibrillation with ventricular response of 67, nonspecific IVCD with left ventricular hypertrophy and  repolarization changes. I personally reviewed this EKG  ASSESSMENT AND PLAN:   Paroxysmal atrial fibrillation Paroxysmal atrial fibrillation currently in A. Fib with a controlled ventricular response on amiodarone, and Pradaxa  anticoagulation  Hyperlipidemia History of hyperlipidemia on simvastatin 20 mg a day. His last lipid profile performed a year ago revealed a total cholesterol 112, LDL 59 and HDL 40. We will recheck a lipid and liver profile  Essential hypertension History of hypertension blood pressure measured at 122/78. He is on lisinopril 5 mg a day and verapamil for rate control and blood pressure. 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 Jenne Campus. He does have moderate LV dysfunction with an ejection fraction of 40%. 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 He denies chest pain or shortness of breath.      Runell Gess MD FACP,FACC,FAHA, [facility] 09/21/2014 8:01 AM

## 2014-09-21 NOTE — Assessment & Plan Note (Signed)
History of hypertension blood pressure measured at 122/78. He is on lisinopril 5 mg a day and verapamil for rate control and blood pressure. Continue current meds at current dosing

## 2014-09-21 NOTE — Assessment & Plan Note (Signed)
Paroxysmal atrial fibrillation currently in A. Fib with a controlled ventricular response on amiodarone, and Pradaxa  anticoagulation

## 2014-09-22 ENCOUNTER — Other Ambulatory Visit: Payer: Self-pay | Admitting: Cardiovascular Disease

## 2014-09-23 LAB — LIPID PANEL W/O CHOL/HDL RATIO
CHOLESTEROL TOTAL: 120 mg/dL (ref 100–199)
HDL: 29 mg/dL — AB (ref >39)
LDL CALC: 75 mg/dL (ref 0–99)
Triglycerides: 82 mg/dL (ref 0–149)
VLDL CHOLESTEROL CAL: 16 mg/dL (ref 5–40)

## 2014-09-23 LAB — HEPATIC FUNCTION PANEL
ALT: 23 IU/L (ref 0–44)
AST: 23 IU/L (ref 0–40)
Albumin: 4.3 g/dL (ref 3.6–4.8)
Alkaline Phosphatase: 45 IU/L (ref 39–117)
BILIRUBIN TOTAL: 0.4 mg/dL (ref 0.0–1.2)
BILIRUBIN, DIRECT: 0.17 mg/dL (ref 0.00–0.40)
Total Protein: 6.9 g/dL (ref 6.0–8.5)

## 2014-09-28 ENCOUNTER — Encounter: Payer: Self-pay | Admitting: Cardiovascular Disease

## 2014-09-29 ENCOUNTER — Encounter: Payer: Self-pay | Admitting: *Deleted

## 2015-02-25 ENCOUNTER — Other Ambulatory Visit: Payer: Self-pay | Admitting: Cardiovascular Disease

## 2015-04-14 ENCOUNTER — Telehealth: Payer: Self-pay | Admitting: Cardiovascular Disease

## 2015-04-14 MED ORDER — DABIGATRAN ETEXILATE MESYLATE 150 MG PO CAPS: 150.0000 mg | ORAL_CAPSULE | Freq: Two times a day (BID) | ORAL | Status: AC

## 2015-04-14 MED ORDER — TAMSULOSIN HCL 0.4 MG PO CAPS: 0.4000 mg | ORAL_CAPSULE | Freq: Every day | ORAL | Status: DC

## 2015-04-14 MED ORDER — VERAPAMIL HCL ER 120 MG PO CP24: 120.0000 mg | ORAL_CAPSULE | Freq: Every day | ORAL | Status: DC

## 2015-04-14 MED ORDER — FENOFIBRATE 160 MG PO TABS: 160.0000 mg | ORAL_TABLET | Freq: Every day | ORAL | Status: DC

## 2015-04-14 MED ORDER — LISINOPRIL 5 MG PO TABS: 5.0000 mg | ORAL_TABLET | Freq: Every day | ORAL | Status: DC

## 2015-04-14 MED ORDER — SIMVASTATIN 20 MG PO TABS: 20.0000 mg | ORAL_TABLET | Freq: Every day | ORAL | Status: DC

## 2015-04-14 MED ORDER — AMIODARONE HCL 200 MG PO TABS: 100.0000 mg | ORAL_TABLET | Freq: Every day | ORAL | Status: AC

## 2015-04-14 NOTE — Telephone Encounter (Signed)
Left msg for patient informing Rxs printed & left at front desk for pickup.

## 2015-04-14 NOTE — Telephone Encounter (Signed)
New message      Pt will be changing ins on march 3.  He will be retiring.  He want to pick up written prescriptions for fenofibrate , verapamil , simvastatin , lisinopril , amiodarone , pradaxa  and tamsulosin 0.4mg .  Please call when ready.  He will be moving in April and will need to get these filled but not sure when he will get them filled.  He will shop around at the pharmacy.

## 2015-12-05 ENCOUNTER — Other Ambulatory Visit: Payer: Self-pay | Admitting: Cardiovascular Disease

## 2015-12-05 MED ORDER — FENOFIBRATE 160 MG PO TABS: 160.0000 mg | ORAL_TABLET | Freq: Every day | ORAL | 0 refills | Status: AC

## 2015-12-05 MED ORDER — LISINOPRIL 5 MG PO TABS: 5.0000 mg | ORAL_TABLET | Freq: Every day | ORAL | 0 refills | Status: DC

## 2015-12-06 ENCOUNTER — Other Ambulatory Visit: Payer: Self-pay

## 2015-12-06 MED ORDER — TAMSULOSIN HCL 0.4 MG PO CAPS: 0.4000 mg | ORAL_CAPSULE | Freq: Every day | ORAL | 0 refills | Status: DC

## 2015-12-06 MED ORDER — SIMVASTATIN 20 MG PO TABS: 20.0000 mg | ORAL_TABLET | Freq: Every day | ORAL | 0 refills | Status: AC

## 2015-12-06 MED ORDER — LISINOPRIL 5 MG PO TABS: 5.0000 mg | ORAL_TABLET | Freq: Every day | ORAL | 0 refills | Status: DC

## 2015-12-06 MED ORDER — VERAPAMIL HCL ER 120 MG PO CP24: 120.0000 mg | ORAL_CAPSULE | Freq: Every day | ORAL | 0 refills | Status: DC

## 2015-12-07 ENCOUNTER — Other Ambulatory Visit: Payer: Self-pay | Admitting: *Deleted

## 2015-12-07 MED ORDER — TAMSULOSIN HCL 0.4 MG PO CAPS: 0.4000 mg | ORAL_CAPSULE | Freq: Every day | ORAL | 0 refills | Status: DC

## 2016-01-02 ENCOUNTER — Other Ambulatory Visit: Payer: Self-pay

## 2016-01-02 MED ORDER — TAMSULOSIN HCL 0.4 MG PO CAPS: 0.4000 mg | ORAL_CAPSULE | Freq: Every day | ORAL | 0 refills | Status: DC

## 2016-01-02 NOTE — Telephone Encounter (Signed)
Rx(s) sent to pharmacy electronically.  

## 2016-01-03 ENCOUNTER — Other Ambulatory Visit: Payer: Self-pay | Admitting: *Deleted

## 2016-01-03 ENCOUNTER — Other Ambulatory Visit: Payer: Self-pay

## 2016-01-04 ENCOUNTER — Other Ambulatory Visit: Payer: Self-pay

## 2016-01-04 ENCOUNTER — Other Ambulatory Visit: Payer: Self-pay | Admitting: *Deleted

## 2016-01-04 MED ORDER — LISINOPRIL 5 MG PO TABS: 5.0000 mg | ORAL_TABLET | Freq: Every day | ORAL | 0 refills | Status: AC

## 2016-01-04 MED ORDER — VERAPAMIL HCL ER 120 MG PO CP24: 120.0000 mg | ORAL_CAPSULE | Freq: Every day | ORAL | 0 refills | Status: DC

## 2016-01-04 MED ORDER — TAMSULOSIN HCL 0.4 MG PO CAPS: 0.4000 mg | ORAL_CAPSULE | Freq: Every day | ORAL | 8 refills | Status: DC

## 2016-01-04 NOTE — Telephone Encounter (Signed)
Rx(s) sent to pharmacy electronically.  

## 2016-01-20 ENCOUNTER — Other Ambulatory Visit: Payer: Self-pay | Admitting: *Deleted

## 2016-01-20 MED ORDER — VERAPAMIL HCL ER 120 MG PO CP24: 120.0000 mg | ORAL_CAPSULE | Freq: Every day | ORAL | 0 refills | Status: AC

## 2016-01-20 NOTE — Telephone Encounter (Signed)
Rx request sent to pharmacy.  

## 2017-01-19 ENCOUNTER — Other Ambulatory Visit: Payer: Self-pay | Admitting: Cardiovascular Disease

## 2022-06-28 ENCOUNTER — Encounter: Payer: Self-pay | Admitting: *Deleted
